# Patient Record
Sex: Female | Born: 1935 | Race: White | Hispanic: No | Smoking: Former smoker
Health system: Southern US, Community
[De-identification: ages and names within clinical notes are randomized; demographics above are authoritative.]

## PROBLEM LIST (undated history)

## (undated) DIAGNOSIS — K5790 Diverticulosis of intestine, part unspecified, without perforation or abscess without bleeding: Secondary | ICD-10-CM

## (undated) DIAGNOSIS — K219 Gastro-esophageal reflux disease without esophagitis: Secondary | ICD-10-CM

## (undated) DIAGNOSIS — H5462 Unqualified visual loss, left eye, normal vision right eye: Secondary | ICD-10-CM

## (undated) DIAGNOSIS — K2270 Barrett's esophagus without dysplasia: Secondary | ICD-10-CM

## (undated) DIAGNOSIS — K449 Diaphragmatic hernia without obstruction or gangrene: Secondary | ICD-10-CM

## (undated) DIAGNOSIS — I10 Essential (primary) hypertension: Secondary | ICD-10-CM

## (undated) DIAGNOSIS — F32A Depression, unspecified: Secondary | ICD-10-CM

## (undated) DIAGNOSIS — E782 Mixed hyperlipidemia: Secondary | ICD-10-CM

## (undated) DIAGNOSIS — I471 Supraventricular tachycardia: Secondary | ICD-10-CM

## (undated) DIAGNOSIS — C189 Malignant neoplasm of colon, unspecified: Secondary | ICD-10-CM

## (undated) DIAGNOSIS — F329 Major depressive disorder, single episode, unspecified: Secondary | ICD-10-CM

## (undated) DIAGNOSIS — I4719 Other supraventricular tachycardia: Secondary | ICD-10-CM

## (undated) DIAGNOSIS — J449 Chronic obstructive pulmonary disease, unspecified: Secondary | ICD-10-CM

## (undated) DIAGNOSIS — Z91018 Allergy to other foods: Secondary | ICD-10-CM

## (undated) HISTORY — DX: Unqualified visual loss, left eye, normal vision right eye: H54.62

## (undated) HISTORY — DX: Supraventricular tachycardia: I47.1

## (undated) HISTORY — DX: Chronic obstructive pulmonary disease, unspecified: J44.9

## (undated) HISTORY — PX: BREAST SURGERY: SHX581

## (undated) HISTORY — DX: Major depressive disorder, single episode, unspecified: F32.9

## (undated) HISTORY — DX: Allergy to other foods: Z91.018

## (undated) HISTORY — DX: Depression, unspecified: F32.A

## (undated) HISTORY — PX: UPPER GASTROINTESTINAL ENDOSCOPY: SHX188

## (undated) HISTORY — DX: Diaphragmatic hernia without obstruction or gangrene: K44.9

## (undated) HISTORY — PX: OTHER SURGICAL HISTORY: SHX169

## (undated) HISTORY — PX: COLONOSCOPY: SHX174

## (undated) HISTORY — DX: Essential (primary) hypertension: I10

## (undated) HISTORY — DX: Mixed hyperlipidemia: E78.2

## (undated) HISTORY — DX: Gastro-esophageal reflux disease without esophagitis: K21.9

## (undated) HISTORY — DX: Malignant neoplasm of colon, unspecified: C18.9

## (undated) HISTORY — DX: Diverticulosis of intestine, part unspecified, without perforation or abscess without bleeding: K57.90

## (undated) HISTORY — DX: Other supraventricular tachycardia: I47.19

## (undated) HISTORY — DX: Barrett's esophagus without dysplasia: K22.70

---

## 2002-08-28 HISTORY — PX: EYE SURGERY: SHX253

## 2008-08-28 HISTORY — PX: RIGHT COLECTOMY: SHX853

## 2011-04-25 ENCOUNTER — Other Ambulatory Visit: Payer: Self-pay

## 2011-12-15 ENCOUNTER — Other Ambulatory Visit: Payer: Self-pay

## 2012-01-08 ENCOUNTER — Encounter: Payer: Self-pay | Admitting: Cardiology

## 2012-01-08 ENCOUNTER — Ambulatory Visit (INDEPENDENT_AMBULATORY_CARE_PROVIDER_SITE_OTHER): Payer: Medicare Other | Admitting: Cardiology

## 2012-01-08 VITALS — BP 134/84 | HR 81 | Ht 65.0 in | Wt 177.0 lb

## 2012-01-08 DIAGNOSIS — I471 Supraventricular tachycardia: Secondary | ICD-10-CM

## 2012-01-08 DIAGNOSIS — R0989 Other specified symptoms and signs involving the circulatory and respiratory systems: Secondary | ICD-10-CM

## 2012-01-08 DIAGNOSIS — H5462 Unqualified visual loss, left eye, normal vision right eye: Secondary | ICD-10-CM | POA: Insufficient documentation

## 2012-01-08 DIAGNOSIS — I10 Essential (primary) hypertension: Secondary | ICD-10-CM

## 2012-01-08 DIAGNOSIS — H5460 Unqualified visual loss, one eye, unspecified: Secondary | ICD-10-CM

## 2012-01-08 DIAGNOSIS — E782 Mixed hyperlipidemia: Secondary | ICD-10-CM

## 2012-01-08 DIAGNOSIS — R002 Palpitations: Secondary | ICD-10-CM

## 2012-01-08 DIAGNOSIS — I779 Disorder of arteries and arterioles, unspecified: Secondary | ICD-10-CM | POA: Insufficient documentation

## 2012-01-08 DIAGNOSIS — I639 Cerebral infarction, unspecified: Secondary | ICD-10-CM

## 2012-01-08 NOTE — Progress Notes (Signed)
Clinical Summary Nichole Meza is a 78 y.o.female referred for cardiology consultation by Dr. Susa Griffins, a dermatologist in [place]. She was previously followed by Dr. Mayford Knife for primary care. She presents with a history of hypertension, no definite CAD or myocardial infarction. Does report a prior history of PAT, describes more frequent sensation of palpitations of late, however no dizziness or syncope. Approximately one month ago she experienced sudden decrease in vision involving the left eye, describes a feeling as if she had "ice on the windshield" on that side. She reports gaining vision back, approximately 1/3 of what she had lost originally, describes field cuts more toward the center of the right side involving her left visual field.  Labwork from April reviewed including hemoglobin A1c 6.1%, vitamin D 51, magnesium 4.4, homocysteine 11.1. Previous lab work also reviewed 2012 showing cholesterol 176, HDL 56, LDL 100, triglycerides 99, potassium 4.2, sodium 140, BUN 14, creatinine 0.8.  We reviewed her medications. Most recent addition was lisinopril. She was already on Norvasc. She had also been using a p.r.n. diuretic in the past. She reports compliance with statin therapy, has also been cutting back carbohydrates in her diet.  She reports no known history of carotid artery disease, indicates no previous imaging studies. Has had no imaging of her brain recently. Reports a remote cardiac workup several years ago that was reassuring overall. No details available.  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 She plays golf 4 days a week, otherwise no regular exercise.  Allergies  Allergen Reactions  . Hctz (Hydrochlorothiazide) Rash  . Nexium (Esomeprazole Magnesium) Rash  . Prilosec (Omeprazole) Rash  . Vancomycin Other (See Comments)    BP dropped    Current Outpatient Prescriptions  Medication Sig Dispense Refill  .  amLODipine (NORVASC) 5 MG tablet Take 1 tablet by mouth Daily.      Marland Kitchen Apple Cid Vn-Grn Tea-Bit Or-Cr (GREEN TEA SLIM) TABS Take 2 tablets by mouth daily.      Marland Kitchen aspirin EC 81 MG tablet Take 81 mg by mouth daily.      Marland Kitchen atorvastatin (LIPITOR) 10 MG tablet Take 1 tablet by mouth Daily.      Florian Buff, Borago officinalis, (BORAGE OIL) 1000 MG CAPS Take 2 capsules by mouth daily.      . busPIRone (BUSPAR) 15 MG tablet Take 15 mg by mouth daily.      . Cholecalciferol (D-3-5) 5000 UNITS capsule Take 5,000 Units by mouth daily.      Marland Kitchen desloratadine (CLARINEX) 5 MG tablet Take 5 mg by mouth daily.      . fish oil-omega-3 fatty acids 1000 MG capsule Take 2 g by mouth daily.       . Flaxseed, Linseed, (FLAX SEED OIL) 1000 MG CAPS Take 2 capsules by mouth daily.       Marland Kitchen FLUoxetine (PROZAC) 20 MG capsule Take 20 mg by mouth daily.      . IRON CR PO Take 1 tablet by mouth daily.      . lansoprazole (PREVACID) 30 MG capsule Take 30 mg by mouth daily.      Marland Kitchen lisinopril (PRINIVIL,ZESTRIL) 10 MG tablet Take 1 tablet by mouth Daily.      . magnesium oxide (MAG-OX 400) 400 (241.3 MG) MG tablet Take 400 mg by mouth daily.      . montelukast (SINGULAIR) 10 MG tablet Take 1 tablet by mouth Daily.      Marland Kitchen  thiamine (VITAMIN B-1) 50 MG tablet Take 50 mg by mouth daily.      Marland Kitchen tiotropium (SPIRIVA) 18 MCG inhalation capsule Place 18 mcg into inhaler and inhale daily.      . Tryptophan 500 MG CAPS Take 1 capsule by mouth daily.      . vitamin A 16109 UNIT capsule Take 10,000 Units by mouth daily.      . vitamin C (ASCORBIC ACID) 500 MG tablet Take 500 mg by mouth 2 (two) times daily.        Past Medical History  Diagnosis Date  . Essential hypertension, benign   . Mixed hyperlipidemia   . COPD (chronic obstructive pulmonary disease)   . Depression   . Barrett's esophagus   . Diverticulosis   . Hiatal hernia   . Colon cancer   . Vision loss of left eye     Sudden - 4/13  . PAT (paroxysmal atrial tachycardia)       Years ago was more problematic    Past Surgical History  Procedure Date  . Left knee surgery   . Colon tumor resection 2010  . Bilateral cataract surgery     Family History  Problem Relation Age of Onset  . Dementia Mother   . Coronary artery disease Father     Lived to age 78    Social History Nichole Meza reports that she quit smoking about 28 years ago. Her smoking use included Cigarettes. She has a 20 pack-year smoking history. She has never used smokeless tobacco. Ms. Hardcastle reports that she does not drink alcohol.  Review of Systems No syncope, no reported bleeding problems. Stable appetite. No orthopnea or PND. Occasional mild lower extremity edema. Reports trouble with allergies, no major flares in COPD. Otherwise negative except as outlined.  Physical Examination Filed Vitals:   01/08/12 1259  BP: 134/84  Pulse: 81   Overweight woman in no acute distress. HEENT: Conjunctiva and lids normal, oropharynx clear. Neck: Supple, no elevated JVP, very soft left carotid bruit, no thyromegaly. Lungs: Clear to auscultation, nonlabored breathing at rest. Cardiac: Regular rate and rhythm, no S3, soft systolic murmur, no pericardial rub. Abdomen: Soft, nontender, bowel sounds present, no guarding or rebound. Extremities: Trace edema and spider veins, distal pulses 1-2+. Skin: Warm and dry. Musculoskeletal: No kyphosis. Neuropsychiatric: Alert and oriented x3, affect grossly appropriate.   ECG Reviewed in EMR.   Problem List and Plan   Vision loss of left eye Patient states that she saw an ophthalmologist and has additional followup pending. She tells me that she was told she had some "swelling" seen on her eye examination. Possibility of a thromboembolic event is certainly to be considered. She reports no other history of cerebrovascular disease. Has been on aspirin standing. We discussed proceeding with a noncontrasted head CT (could not have MRI with metal related to  prior foot pinning and knee surgery), carotid Dopplers, also 2-D echocardiogram. I will have her wear a cardiac monitor for 7 days to exclude atrial fibrillation or flutter. Depending on these studies and her clinical progress, we can determine the next step. Office followup arranged.  Palpitations Reports remote history of PAT.  Need to exclude atrial fibrillation or flutter.  PAT (paroxysmal atrial tachycardia) Reports no history of dizziness or syncope. No prolonged palpitations recently.  Essential hypertension, benign No changes made to current regimen. Also discussed diet and exercise measures. Seems to be avoiding excess sodium reasonably well.  Carotid bruit Very soft, left-sided. Carotid Dopplers  ordered.  Mixed hyperlipidemia On statin therapy. Would aim for LDL under 100 if possible.     Jonelle Sidle, M.D., F.A.C.C.

## 2012-01-08 NOTE — Patient Instructions (Signed)
   7 day cardiac monitor  Echo  Head CT without contrast  Carotid dopplers If the results of your test are normal or stable, you will receive a letter.  If they are abnormal, the nurse will contact you by phone. Follow up in  3-4 weeks

## 2012-01-08 NOTE — Assessment & Plan Note (Signed)
Reports no history of dizziness or syncope. No prolonged palpitations recently.

## 2012-01-08 NOTE — Assessment & Plan Note (Signed)
On statin therapy. Would aim for LDL under 100 if possible.

## 2012-01-08 NOTE — Assessment & Plan Note (Signed)
Patient states that she saw an ophthalmologist and has additional followup pending. She tells me that she was told she had some "swelling" seen on her eye examination. Possibility of a thromboembolic event is certainly to be considered. She reports no other history of cerebrovascular disease. Has been on aspirin standing. We discussed proceeding with a noncontrasted head CT (could not have MRI with metal related to prior foot pinning and knee surgery), carotid Dopplers, also 2-D echocardiogram. I will have her wear a cardiac monitor for 7 days to exclude atrial fibrillation or flutter. Depending on these studies and her clinical progress, we can determine the next step. Office followup arranged.

## 2012-01-08 NOTE — Assessment & Plan Note (Signed)
Reports remote history of PAT.  Need to exclude atrial fibrillation or flutter.

## 2012-01-08 NOTE — Assessment & Plan Note (Signed)
Very soft, left-sided. Carotid Dopplers ordered.

## 2012-01-08 NOTE — Assessment & Plan Note (Signed)
No changes made to current regimen. Also discussed diet and exercise measures. Seems to be avoiding excess sodium reasonably well.

## 2012-01-09 ENCOUNTER — Telehealth: Payer: Self-pay | Admitting: Cardiology

## 2012-01-09 ENCOUNTER — Other Ambulatory Visit: Payer: Self-pay | Admitting: Cardiology

## 2012-01-09 DIAGNOSIS — I639 Cerebral infarction, unspecified: Secondary | ICD-10-CM

## 2012-01-09 DIAGNOSIS — H5462 Unqualified visual loss, left eye, normal vision right eye: Secondary | ICD-10-CM

## 2012-01-09 DIAGNOSIS — R0989 Other specified symptoms and signs involving the circulatory and respiratory systems: Secondary | ICD-10-CM

## 2012-01-09 NOTE — Telephone Encounter (Signed)
CT SCAN OF HEAD W/O CONTRAST 2 D ECHO CAROTID DOPPLERS Scheduled for 01/10/2012 @ [facility] Checking percert

## 2012-01-09 NOTE — Telephone Encounter (Signed)
No precert required per Kenney Houseman. @ [employer] [phone]

## 2012-01-10 DIAGNOSIS — I4891 Unspecified atrial fibrillation: Secondary | ICD-10-CM

## 2012-01-11 DIAGNOSIS — I471 Supraventricular tachycardia: Secondary | ICD-10-CM

## 2012-01-11 DIAGNOSIS — R002 Palpitations: Secondary | ICD-10-CM

## 2012-01-12 ENCOUNTER — Telehealth: Payer: Self-pay | Admitting: *Deleted

## 2012-01-12 NOTE — Telephone Encounter (Signed)
Patient informed. 

## 2012-01-12 NOTE — Telephone Encounter (Signed)
Message copied by Eustace Moore on Fri Jan 12, 2012  4:25 PM ------      Message from: MCDOWELL, Illene Bolus      Created: Thu Jan 11, 2012  1:03 PM       Reviewed. LV function is normal without wall motion abnormality. No major valvular abnormalities. No thrombus.

## 2012-01-12 NOTE — Telephone Encounter (Signed)
Message copied by Eustace Moore on Fri Jan 12, 2012  4:24 PM ------      Message from: MCDOWELL, Illene Bolus      Created: Thu Jan 11, 2012  1:04 PM       Reviewed. Less than 50% RICA stenosis, 50-69% LICA stenosis with moderate calcified plaque. Will discuss further with her at followup.

## 2012-01-12 NOTE — Telephone Encounter (Signed)
Left message for patient to call office.  

## 2012-01-12 NOTE — Telephone Encounter (Signed)
Message copied by Eustace Moore on Fri Jan 12, 2012  4:21 PM ------      Message from: MCDOWELL, Illene Bolus      Created: Thu Jan 11, 2012  1:06 PM       Reviewed. No reported acute findings, although there is evidence of an old, small lacunar infarct involving the right caudate. Would probably be best to initiate Plavix 75 mg daily instead of aspirin at this point, particularly with mild to moderate carotid disease. Await results of cardiac monitor to ensure that there are no other arrhythmias to consider.

## 2012-01-12 NOTE — Telephone Encounter (Signed)
Message copied by Eustace Moore on Fri Jan 12, 2012  3:44 PM ------      Message from: MCDOWELL, Illene Bolus      Created: Thu Jan 11, 2012  1:04 PM       Reviewed. Less than 50% RICA stenosis, 50-69% LICA stenosis with moderate calcified plaque. Will discuss further with her at followup.

## 2012-01-15 ENCOUNTER — Other Ambulatory Visit: Payer: Self-pay | Admitting: *Deleted

## 2012-01-15 MED ORDER — CLOPIDOGREL BISULFATE 75 MG PO TABS: 75.0000 mg | ORAL_TABLET | Freq: Every day | ORAL | Status: DC

## 2012-02-09 ENCOUNTER — Ambulatory Visit: Payer: Medicare Other | Admitting: Cardiology

## 2012-02-23 ENCOUNTER — Encounter: Payer: Self-pay | Admitting: Cardiology

## 2012-02-23 ENCOUNTER — Ambulatory Visit (INDEPENDENT_AMBULATORY_CARE_PROVIDER_SITE_OTHER): Payer: Medicare Other | Admitting: Cardiology

## 2012-02-23 VITALS — BP 110/70 | HR 87 | Ht 65.5 in | Wt 171.0 lb

## 2012-02-23 DIAGNOSIS — R002 Palpitations: Secondary | ICD-10-CM

## 2012-02-23 DIAGNOSIS — H5460 Unqualified visual loss, one eye, unspecified: Secondary | ICD-10-CM

## 2012-02-23 DIAGNOSIS — E782 Mixed hyperlipidemia: Secondary | ICD-10-CM

## 2012-02-23 DIAGNOSIS — I10 Essential (primary) hypertension: Secondary | ICD-10-CM

## 2012-02-23 DIAGNOSIS — H5462 Unqualified visual loss, left eye, normal vision right eye: Secondary | ICD-10-CM

## 2012-02-23 DIAGNOSIS — I779 Disorder of arteries and arterioles, unspecified: Secondary | ICD-10-CM

## 2012-02-23 MED ORDER — CLOPIDOGREL BISULFATE 75 MG PO TABS: 75.0000 mg | ORAL_TABLET | Freq: Every day | ORAL | Status: DC

## 2012-02-23 NOTE — Assessment & Plan Note (Signed)
Blood pressure looks well controlled today. 

## 2012-02-23 NOTE — Assessment & Plan Note (Signed)
No evidence of atrial fibrillation or atrial flutter by recent cardiac monitoring.

## 2012-02-23 NOTE — Patient Instructions (Signed)
Your physician recommends that you schedule a follow-up appointment in: 6 months with [facility]. You will receive a reminder letter in the mail in about 4 months reminding you to call and schedule your appointment. If you don't receive this letter, please contact our office.   Your physician recommends that you continue on your current medications as directed. Please refer to the Current Medication list given to you today. Stop taking aspirin.   Your physician has requested that you have a carotid duplex in 6 months just before your next appointment. This test is an ultrasound of the carotid arteries in your neck. It looks at blood flow through these arteries that supply the brain with blood. Allow one hour for this exam. There are no restrictions or special instructions.

## 2012-02-23 NOTE — Progress Notes (Signed)
Clinical Summary Ms. Girton is a 78 y.o.female presenting for followup. She was seen in May. She states that she has been doing reasonably well, has lost 6 pounds since last visit through diet. Still following with an ophthalmologist for her vision change. States that this has been improving somewhat.  Carotid Dopplers demonstrated less than 50% RICA stenosis with 50-69% LICA stenosis and moderately calcified plaque. Echocardiogram demonstrated LVEF of 60-65% with diastolic dysfunction, moderate aortic sclerosis with trace aortic regurgitation, trace tricuspid regurgitation, RVSP 30 mm mercury. Head CT scan demonstrated no acute findings but evidence of an old, small lacunar infarct involving the right caudate. Cardiac monitor showed sinus rhythm with no evidence of atrial fibrillation or flutter. We reviewed all these studies today.  She has been taking both aspirin and Plavix, reports easy bruising. Otherwise no major bleeding problems.   Allergies  Allergen Reactions  . Hctz (Hydrochlorothiazide) Rash  . Nexium (Esomeprazole Magnesium) Rash  . Prilosec (Omeprazole) Rash  . Vancomycin Other (See Comments)    BP dropped    Current Outpatient Prescriptions  Medication Sig Dispense Refill  . amLODipine (NORVASC) 5 MG tablet Take 1 tablet by mouth Daily.      Marland Kitchen atorvastatin (LIPITOR) 10 MG tablet Take 1 tablet by mouth Daily.      Florian Buff, Borago officinalis, (BORAGE OIL) 1000 MG CAPS Take 2 capsules by mouth daily.      . Cholecalciferol (D-3-5) 5000 UNITS capsule Take 5,000 Units by mouth daily.      . clopidogrel (PLAVIX) 75 MG tablet Take 1 tablet (75 mg total) by mouth daily.  30 tablet  6  . desloratadine (CLARINEX) 5 MG tablet Take 5 mg by mouth daily.      . fish oil-omega-3 fatty acids 1000 MG capsule Take 2 g by mouth daily.       . Flaxseed, Linseed, (FLAX SEED OIL) 1000 MG CAPS Take 2 capsules by mouth daily.       . fluorouracil (EFUDEX) 5 % cream Apply 1 application  topically as needed.       Chilton Si Tea, Camillia sinensis, ([employer] GREEN TEA EXTRACT PO) Take 400 mg by mouth daily.      . imiquimod (ALDARA) 5 % cream Apply 1 application topically as needed.       . IRON PO Take 1 capsule by mouth daily. Daily two caps with Iron      . magnesium oxide (MAG-OX 400) 400 (241.3 MG) MG tablet Take 400 mg by mouth daily.      . montelukast (SINGULAIR) 10 MG tablet Take 1 tablet by mouth as needed.       . sucralfate (CARAFATE) 1 G tablet Take 1 g by mouth daily.       Marland Kitchen thiamine (VITAMIN B-1) 50 MG tablet Take 50 mg by mouth daily.      Marland Kitchen tiotropium (SPIRIVA) 18 MCG inhalation capsule Place 18 mcg into inhaler and inhale as needed.       . tretinoin (RETIN-A) 0.05 % cream Apply 1 application topically daily.       . Tryptophan 500 MG CAPS Take 1 capsule by mouth daily.      . vitamin A 14782 UNIT capsule Take 10,000 Units by mouth daily.      . vitamin C (ASCORBIC ACID) 500 MG tablet Take 500 mg by mouth 2 (two) times daily.        Past Medical History  Diagnosis Date  . Essential hypertension, benign   .  Mixed hyperlipidemia   . COPD (chronic obstructive pulmonary disease)   . Depression   . Barrett's esophagus   . Diverticulosis   . Hiatal hernia   . Colon cancer   . Vision loss of left eye     Sudden - 4/13  . PAT (paroxysmal atrial tachycardia)     Years ago was more problematic    Social History Ms. Wollenberg reports that she quit smoking about 28 years ago. Her smoking use included Cigarettes. She has a 20 pack-year smoking history. She has never used smokeless tobacco. Ms. Ballen reports that she does not drink alcohol.  Review of Systems Occasional palpitations, occasional lightheadedness. No syncope. No exertional chest pain. Improved dyspnea on exertion. Otherwise negative.  Physical Examination Filed Vitals:   02/23/12 0849  BP: 110/70  Pulse: 87    Overweight woman in no acute distress.  HEENT: Conjunctiva and lids normal, oropharynx  clear.  Neck: Supple, no elevated JVP, very soft left carotid bruit, no thyromegaly.  Lungs: Clear to auscultation, nonlabored breathing at rest.  Cardiac: Regular rate and rhythm, no S3, soft systolic murmur, no pericardial rub.  Abdomen: Soft, nontender, bowel sounds present, no guarding or rebound.  Extremities: Trace edema and spider veins, distal pulses 1-2+.  Skin: Warm and dry.  Musculoskeletal: No kyphosis.  Neuropsychiatric: Alert and oriented x3, affect grossly appropriate.   Problem List and Plan   Palpitations No evidence of atrial fibrillation or atrial flutter by recent cardiac monitoring.  Vision loss of left eye Most likely embolic event. As described, no definite atrial arrhythmias were noted by cardiac monitoring. She does have evidence of a previous small lacunar CVA by head CT, also moderate carotid artery disease. She will continue Plavix, discontinue aspirin for now. Otherwise focus on risk factor modification, blood pressure reduction, lipid management aiming for LDL under 100.  Mixed hyperlipidemia Continue on statin therapy, goal LDL at least under 100.  Essential hypertension, benign Blood pressure looks well controlled today.  Carotid artery disease Followup carotid Dopplers in 6 months.    Jonelle Sidle, M.D., F.A.C.C.

## 2012-02-23 NOTE — Assessment & Plan Note (Signed)
Continue on statin therapy, goal LDL at least under 100.

## 2012-02-23 NOTE — Assessment & Plan Note (Signed)
Followup carotid Dopplers in 6 months. 

## 2012-02-23 NOTE — Assessment & Plan Note (Signed)
Most likely embolic event. As described, no definite atrial arrhythmias were noted by cardiac monitoring. She does have evidence of a previous small lacunar CVA by head CT, also moderate carotid artery disease. She will continue Plavix, discontinue aspirin for now. Otherwise focus on risk factor modification, blood pressure reduction, lipid management aiming for LDL under 100.

## 2012-02-23 NOTE — Addendum Note (Signed)
Addended by: Eustace Moore on: 02/23/2012 09:15 AM   Modules accepted: Orders

## 2012-07-24 ENCOUNTER — Other Ambulatory Visit: Payer: Self-pay | Admitting: *Deleted

## 2012-07-24 DIAGNOSIS — I6529 Occlusion and stenosis of unspecified carotid artery: Secondary | ICD-10-CM

## 2012-07-31 ENCOUNTER — Encounter (INDEPENDENT_AMBULATORY_CARE_PROVIDER_SITE_OTHER): Payer: Medicare Other

## 2012-07-31 DIAGNOSIS — I6529 Occlusion and stenosis of unspecified carotid artery: Secondary | ICD-10-CM

## 2012-08-06 ENCOUNTER — Telehealth: Payer: Self-pay | Admitting: *Deleted

## 2012-08-06 NOTE — Telephone Encounter (Signed)
Message copied by Eustace Moore on Tue Aug 06, 2012  8:55 AM ------      Message from: MCDOWELL, Illene Bolus      Created: Mon Aug 05, 2012 11:22 AM       Still with evidence of stable mild to moderate ICA disease as per report. Continue medical therapy and observation.

## 2012-08-06 NOTE — Telephone Encounter (Signed)
Patient informed. 

## 2012-08-16 ENCOUNTER — Ambulatory Visit: Payer: Medicare Other | Admitting: Cardiology

## 2012-10-02 ENCOUNTER — Encounter: Payer: Self-pay | Admitting: Cardiology

## 2012-10-02 ENCOUNTER — Ambulatory Visit (INDEPENDENT_AMBULATORY_CARE_PROVIDER_SITE_OTHER): Payer: Medicare Other | Admitting: Cardiology

## 2012-10-02 ENCOUNTER — Encounter: Payer: Self-pay | Admitting: Internal Medicine

## 2012-10-02 VITALS — BP 112/73 | HR 77 | Ht 65.5 in | Wt 157.0 lb

## 2012-10-02 DIAGNOSIS — I779 Disorder of arteries and arterioles, unspecified: Secondary | ICD-10-CM

## 2012-10-02 MED ORDER — ROSUVASTATIN CALCIUM 5 MG PO TABS: 5.0000 mg | ORAL_TABLET | Freq: Every day | ORAL | Status: DC

## 2012-10-02 NOTE — Progress Notes (Signed)
Clinical Summary Nichole Meza is a 79 y.o.female presenting for followup. She was seen in June 2013. She reports no obvious neurological symptoms, stable vision, no focal motor weakness or speech problems. Furthermore, no sense of palpitations or chest pain.  Carotid Dopplers from December 2013 revealed stable disease, 0-39% RICA stenosis and 40-59% LICA stenosis. We reviewed these today.  She continues on Plavix and Lipitor. She did ask about possibly trying a different statin since she has concurrently been having some difficulty managing her glucose, and was wondering if Lipitor might be contributing.   Allergies  Allergen Reactions  . Hctz (Hydrochlorothiazide) Rash  . Nexium (Esomeprazole Magnesium) Rash  . Prilosec (Omeprazole) Rash  . Vancomycin Other (See Comments)    BP dropped    Current Outpatient Prescriptions  Medication Sig Dispense Refill  . amLODipine (NORVASC) 5 MG tablet Take 1 tablet by mouth Daily.      Florian Buff, Borago officinalis, (BORAGE OIL) 1000 MG CAPS Take 2 capsules by mouth daily.      . Cholecalciferol (D-3-5) 5000 UNITS capsule Take 5,000 Units by mouth daily.      . clopidogrel (PLAVIX) 75 MG tablet Take 1 tablet (75 mg total) by mouth daily.  90 tablet  3  . desloratadine (CLARINEX) 5 MG tablet Take 5 mg by mouth daily as needed.       . fish oil-omega-3 fatty acids 1000 MG capsule Take 2 g by mouth daily.       . Flaxseed, Linseed, (FLAX SEED OIL) 1000 MG CAPS Take 2 capsules by mouth daily.       Marland Kitchen FLUoxetine (PROZAC) 20 MG capsule Take 20 mg by mouth daily.      Chilton Si Tea, Camillia sinensis, ([employer] GREEN TEA EXTRACT PO) Take 400 mg by mouth daily.      . imiquimod (ALDARA) 5 % cream Apply 1 application topically as needed.       Marland Kitchen lisinopril (PRINIVIL,ZESTRIL) 10 MG tablet Take 1 tablet by mouth Daily.      . magnesium oxide (MAG-OX 400) 400 (241.3 MG) MG tablet Take 400 mg by mouth daily.      . sucralfate (CARAFATE) 1 G tablet Take 1 g by mouth  daily.       Marland Kitchen thiamine (VITAMIN B-1) 50 MG tablet Take 50 mg by mouth daily.      Marland Kitchen tiotropium (SPIRIVA) 18 MCG inhalation capsule Place 18 mcg into inhaler and inhale as needed.       . Tryptophan 500 MG CAPS Take 1 capsule by mouth daily.      . vitamin A 52841 UNIT capsule Take 10,000 Units by mouth daily.      . vitamin C (ASCORBIC ACID) 500 MG tablet Take 500 mg by mouth 2 (two) times daily.      . rosuvastatin (CRESTOR) 5 MG tablet Take 1 tablet (5 mg total) by mouth daily.  90 tablet  3  . tretinoin (RETIN-A) 0.05 % cream Apply 1 application topically daily.         Past Medical History  Diagnosis Date  . Essential hypertension, benign   . Mixed hyperlipidemia   . COPD (chronic obstructive pulmonary disease)   . Depression   . Barrett's esophagus   . Diverticulosis   . Hiatal hernia   . Colon cancer   . Vision loss of left eye     Sudden - 4/13  . PAT (paroxysmal atrial tachycardia)     Years ago was  more problematic    Social History Nichole Meza reports that she quit smoking about 29 years ago. Her smoking use included Cigarettes. She has a 20 pack-year smoking history. She has never used smokeless tobacco. Nichole Meza reports that she does not drink alcohol.  Review of Systems Negative except as outlined.  Physical Examination Filed Vitals:   10/02/12 1450  BP: 112/73  Pulse: 77   Filed Weights   10/02/12 1450  Weight: 157 lb (71.215 kg)   No acute distress.  HEENT: Conjunctiva and lids normal, oropharynx clear.  Neck: Supple, no elevated JVP, very soft left carotid bruit, no thyromegaly.  Lungs: Clear to auscultation, nonlabored breathing at rest.  Cardiac: Regular rate and rhythm, no S3, soft systolic murmur, no pericardial rub.  Extremities: Trace edema and spider veins, distal pulses 1-2+.  Neuropsychiatric: Alert and oriented x3, affect grossly appropriate.   Problem List and Plan   Carotid artery disease Mild to moderate as noted above. Plan to  continue Plavix and statin. Followup carotid Dopplers for December 2014.  Mixed hyperlipidemia Plan to switch from Lipitor to Crestor 5 mg daily as discussed above. She can have followup FLP and LFT with primary care physician in about 12 weeks.  Essential hypertension, benign Blood pressure well controlled today.    Jonelle Sidle, M.D., F.A.C.C.

## 2012-10-02 NOTE — Assessment & Plan Note (Signed)
Plan to switch from Lipitor to Crestor 5 mg daily as discussed above. She can have followup FLP and LFT with primary care physician in about 12 weeks.

## 2012-10-02 NOTE — Assessment & Plan Note (Signed)
Blood pressure well-controlled today. 

## 2012-10-02 NOTE — Patient Instructions (Addendum)
Your physician recommends that you schedule a follow-up appointment in: 10 months. You will receive a reminder letter in the mail in about 8 months reminding you to call and schedule your appointment. If you don't receive this letter, please contact our office.  Your physician has recommended you make the following change in your medication: Stop atorvastatin and start crestor 5 mg daily. Your new prescription has been sent to your pharmacy. All other medications will remain the same. Your physician has requested that you have a carotid duplex in December 2014 just before your next appointment. You will be contacted by our office when this is due. This test is an ultrasound of the carotid arteries in your neck. It looks at blood flow through these arteries that supply the brain with blood. Allow one hour for this exam. There are no restrictions or special instructions.

## 2012-10-02 NOTE — Assessment & Plan Note (Signed)
Mild to moderate as noted above. Plan to continue Plavix and statin. Followup carotid Dopplers for December 2014.

## 2012-10-23 ENCOUNTER — Ambulatory Visit (INDEPENDENT_AMBULATORY_CARE_PROVIDER_SITE_OTHER): Payer: Medicare Other | Admitting: Internal Medicine

## 2012-10-23 ENCOUNTER — Encounter: Payer: Self-pay | Admitting: Internal Medicine

## 2012-10-23 VITALS — BP 128/72 | HR 76 | Ht 65.5 in | Wt 161.6 lb

## 2012-10-23 DIAGNOSIS — K219 Gastro-esophageal reflux disease without esophagitis: Secondary | ICD-10-CM

## 2012-10-23 DIAGNOSIS — K2270 Barrett's esophagus without dysplasia: Secondary | ICD-10-CM

## 2012-10-23 NOTE — Patient Instructions (Addendum)
You have been scheduled for an endoscopy with propofol. Please follow written instructions given to you at your visit today. If you use inhalers (even only as needed) or a CPAP machine, please bring them with you on the day of your procedure.  Continue your Plavix .  Thank you for choosing me and [facility].  Iva Boop, M.D., [facility]

## 2012-10-23 NOTE — Progress Notes (Signed)
Subjective:    Patient ID: Nichole Meza, female    DOB: 1931/07/23, 79 y.o.   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 She last had an evaluation for this with EGD and bx in late 2010 showing a large hiatal hernia, columnar changes above suspected Z line and confirmed intestinal metaplasia. She has had other EGD's in past and says no dysplasia/problems. She is ok now - having stopped chronic Prevacid after developing muscle cramps and hypomagnasemia. She has gone on a diet at direction of PCP and lost 20# so rarely has heartburn or hoarseness which is manifestion of her GERD. No dysphagia. Allergies  Allergen Reactions  . Hctz (Hydrochlorothiazide) Rash  . Nexium (Esomeprazole Magnesium) Rash  . Prilosec (Omeprazole) Rash  . Vancomycin Other (See Comments)    BP dropped   Outpatient Prescriptions Prior to Visit  Medication Sig Dispense Refill  . amLODipine (NORVASC) 5 MG tablet Take 1 tablet by mouth Daily.      Florian Buff, Borago officinalis, (BORAGE OIL) 1000 MG CAPS Take 2 capsules by mouth daily.      . Cholecalciferol (D-3-5) 5000 UNITS capsule Take 5,000 Units by mouth daily.      . clopidogrel (PLAVIX) 75 MG tablet Take 1 tablet (75 mg total) by mouth daily.  90 tablet  3  . desloratadine (CLARINEX) 5 MG tablet Take 5 mg by mouth daily as needed.       . fish oil-omega-3 fatty acids 1000 MG capsule Take 2 g by mouth daily.       . Flaxseed, Linseed, (FLAX SEED OIL) 1000 MG CAPS Take 2 capsules by mouth daily.       Marland Kitchen FLUoxetine (PROZAC) 20 MG capsule Take 20 mg by mouth daily.      Chilton Si Tea, Camillia sinensis, ([employer] GREEN TEA EXTRACT PO) Take 400 mg by mouth daily.      . imiquimod (ALDARA) 5 % cream Apply 1 application topically as needed.       Marland Kitchen lisinopril (PRINIVIL,ZESTRIL) 10 MG tablet Take 1 tablet by mouth Daily.      . magnesium oxide (MAG-OX 400) 400 (241.3 MG) MG tablet Take 400 mg by mouth daily.      .  rosuvastatin (CRESTOR) 5 MG tablet Take 1 tablet (5 mg total) by mouth daily.  90 tablet  3  . sucralfate (CARAFATE) 1 G tablet Take 1 g by mouth daily.       Marland Kitchen thiamine (VITAMIN B-1) 50 MG tablet Take 50 mg by mouth daily.      Marland Kitchen tiotropium (SPIRIVA) 18 MCG inhalation capsule Place 18 mcg into inhaler and inhale as needed.       . tretinoin (RETIN-A) 0.05 % cream Apply 1 application topically daily.       . Tryptophan 500 MG CAPS Take 1 capsule by mouth daily.      . vitamin A 40981 UNIT capsule Take 10,000 Units by mouth daily.      . vitamin C (ASCORBIC ACID) 500 MG tablet Take 500 mg by mouth 2 (two) times daily.       No facility-administered medications prior to visit.   Past Medical History  Diagnosis Date  . Essential hypertension, benign   . Mixed hyperlipidemia   . COPD (chronic obstructive pulmonary disease)   . Depression   . Barrett's esophagus   . Diverticulosis   . Hiatal hernia   . Colon cancer   .  Vision loss of left eye     Sudden - 4/13  . PAT (paroxysmal atrial tachycardia)     Years ago was more problematic  . GERD (gastroesophageal reflux disease)    Past Surgical History  Procedure Laterality Date  . Left knee surgery    . Right colectomy  2010  . Bilateral cataract surgery    . Upper gastrointestinal endoscopy    . Colonoscopy     History   Social History  . Marital Status: Widowed    Spouse Name: N/A    Number of Children: 2 1 in [place] and 1 in [place]  .     Occupational History  . RETIRED     Cardiac nurse   Social History Main Topics  . Smoking status: Former Smoker -- 1.00 packs/day for 20 years    Types: Cigarettes    Quit date: 08/29/1983  . Smokeless tobacco: Never Used  . Alcohol Use: No  . Drug Use: No  . Sexually Active: None     Family History  Problem Relation Age of Onset  . Dementia Mother   . Coronary artery disease Father     Lived to age 52        Review of Systems Left episcleritis recently Tx eye  gtt and ibuprofen All other ROS negative or as per HPI    Objective:   Physical Exam General:  Well-developed, well-nourished and in no acute distress Eyes:  anicteric. ENT:   Mouth and posterior pharynx free of lesions.  Neck:   supple  Lungs: Clear to auscultation bilaterally. Heart:  S1S2, no rubs, murmurs, gallops. Abdomen:  soft, non-tender, no hepatosplenomegaly, mass and BS+. + surgical scar  Lymph:  no cervical or supraclavicular adenopathy. Extremities:   no edema Skin   no rash. Neuro:  A&O x 3.  Psych:  appropriate mood and  Affect.   Data Reviewed: 2012 colonoscopy 2010 EGD 2010 esophageal, duodenal and colon pathology    Assessment & Plan:  Barrett's esophagus  GERD (gastroesophageal reflux disease)  Personal Hx Right Colon Cancer resected 2011  1. Schedule EGD to evaluate and perform surveillance The risks and benefits as well as alternatives of endoscopic procedure(s) have been discussed and reviewed. All questions answered. The patient agrees to proceed. Colonoscopy next 2015  I appreciate the opportunity to care for this patient.  ZO:[email or web address],VWUJW, MD

## 2012-10-25 ENCOUNTER — Encounter: Payer: Self-pay | Admitting: Internal Medicine

## 2012-10-25 ENCOUNTER — Ambulatory Visit (AMBULATORY_SURGERY_CENTER): Payer: Medicare Other | Admitting: Internal Medicine

## 2012-10-25 VITALS — BP 122/54 | HR 73 | Temp 98.6°F | Resp 18

## 2012-10-25 DIAGNOSIS — K2270 Barrett's esophagus without dysplasia: Secondary | ICD-10-CM

## 2012-10-25 DIAGNOSIS — K319 Disease of stomach and duodenum, unspecified: Secondary | ICD-10-CM

## 2012-10-25 DIAGNOSIS — D1330 Benign neoplasm of unspecified part of small intestine: Secondary | ICD-10-CM

## 2012-10-25 DIAGNOSIS — K449 Diaphragmatic hernia without obstruction or gangrene: Secondary | ICD-10-CM

## 2012-10-25 DIAGNOSIS — K317 Polyp of stomach and duodenum: Secondary | ICD-10-CM

## 2012-10-25 DIAGNOSIS — K219 Gastro-esophageal reflux disease without esophagitis: Secondary | ICD-10-CM

## 2012-10-25 MED ORDER — SODIUM CHLORIDE 0.9 % IV SOLN: 500.0000 mL | INTRAVENOUS | Status: DC

## 2012-10-25 NOTE — Patient Instructions (Addendum)
There is a tiny area of suspected Barrett's esophagus - and a huge hiatal hernia. I also found a small polyp in the duodenum.  I took biopsies of the possible Barrett's and the polyp. Nothing looks alarming on those two but the hiatal hernia is large and I wonder if it does not twist sometimes.   We will call with biopsy results.  I do not think you need to completely avoid alcohol because of the Barrett's esophagus.  YOU HAD AN ENDOSCOPIC PROCEDURE TODAY AT THE [facility]: Refer to the procedure report that was given to you for any specific questions about what was found during the examination.  If the procedure report does not answer your questions, please call your gastroenterologist to clarify.  If you requested that your care partner not be given the details of your procedure findings, then the procedure report has been included in a sealed envelope for you to review at your convenience later.  YOU SHOULD EXPECT: Some feelings of bloating in the abdomen. Passage of more gas than usual.  Walking can help get rid of the air that was put into your GI tract during the procedure and reduce the bloating. If you had a lower endoscopy (such as a colonoscopy or flexible sigmoidoscopy) you may notice spotting of blood in your stool or on the toilet paper. If you underwent a bowel prep for your procedure, then you may not have a normal bowel movement for a few days.  DIET: Your first meal following the procedure should be a light meal and then it is ok to progress to your normal diet.  A half-sandwich or bowl of soup is an example of a good first meal.  Heavy or fried foods are harder to digest and may make you feel nauseous or bloated.  Likewise meals heavy in dairy and vegetables can cause extra gas to form and this can also increase the bloating.  Drink plenty of fluids but you should avoid alcoholic beverages for 24 hours.  ACTIVITY: Your care partner should take you home directly after the  procedure.  You should plan to take it easy, moving slowly for the rest of the day.  You can resume normal activity the day after the procedure however you should NOT DRIVE or use heavy machinery for 24 hours (because of the sedation medicines used during the test).    SYMPTOMS TO REPORT IMMEDIATELY: A gastroenterologist can be reached at any hour.  During normal business hours, 8:30 AM to 5:00 PM Monday through Friday, call [phone].  After hours and on weekends, please call the GI answering service at [phone] who will take a message and have the physician on call contact you.   Following upper endoscopy (EGD)  Vomiting of blood or coffee ground material  New chest pain or pain under the shoulder blades  Painful or persistently difficult swallowing  New shortness of breath  Fever of 100F or higher  Black, tarry-looking stools  FOLLOW UP: If any biopsies were taken you will be contacted by phone or by letter within the next 1-3 weeks.  Call your gastroenterologist if you have not heard about the biopsies in 3 weeks.  Our staff will call the home number listed on your records the next business day following your procedure to check on you and address any questions or concerns that you may have at that time regarding the information given to you following your procedure. This is a courtesy call and so  if there is no answer at the home number and we have not heard from you through the emergency physician on call, we will assume that you have returned to your regular daily activities without incident.  SIGNATURES/CONFIDENTIALITY: You and/or your care partner have signed paperwork which will be entered into your electronic medical record.  These signatures attest to the fact that that the information above on your After Visit Summary has been reviewed and is understood.  Full responsibility of the confidentiality of this discharge information lies with you and/or your care-partner.  The  office will call you with results and plans.    Do not drink alcohol if you have Barrett's.

## 2012-10-25 NOTE — Progress Notes (Signed)
Called to room to assist during endoscopic procedure.  Patient ID and intended procedure confirmed with present staff. Received instructions for my participation in the procedure from the performing physician. ewm 

## 2012-10-25 NOTE — Op Note (Signed)
[facility] [address] [place], [address]   ENDOSCOPY PROCEDURE REPORT  PATIENT: Nichole, Meza  MR#: [id number] BIRTHDATE: October 28, 1930 , 81  yrs. old GENDER: Female ENDOSCOPIST: Iva Boop, MD, [facility] PROCEDURE DATE:  10/25/2012 PROCEDURE:  EGD w/ biopsy ASA CLASS:     Class II INDICATIONS:  follow up of Barrett's esophagus. MEDICATIONS: Propofol (Diprivan) 170 mg IV, MAC sedation, administered by CRNA, and These medications were titrated to patient response per physician's verbal order TOPICAL ANESTHETIC: Cetacaine Spray  DESCRIPTION OF PROCEDURE: After the risks benefits and alternatives of the procedure were thoroughly explained, informed consent was obtained.  The LB [id number]-H180 [id number] endoscope was introduced through the mouth and advanced to the second portion of the duodenum. Without limitations.  The instrument was slowly withdrawn as the mucosa was fully examined.        ESOPHAGUS: There was evidence of Barrett's esophagus at the gastroesophageal junction.  Slightly irregular z -line which was at 33 cm, above massive hiatal hernia. Two areas that looked columnar, not > 5mm. Multiple biopsies were performed using cold forceps. Sample sent for histology.   A large hiatal hernia was noted.  The hernia may be paraesophageal as scope preferentially retroflexed with in it  DUODENUM: A polypoid shaped sessile polyp measuring 8 mm in size was found in the 2nd part of the duodenum.  Multiple biopsies was performed using cold forceps.  Sample sent for histology.  The remainder of the upper endoscopy exam was otherwise normal. Retroflexed views revealed a hiatal hernia.     The scope was then withdrawn from the patient and the procedure completed.  COMPLICATIONS: There were no complications. ENDOSCOPIC IMPRESSION: 1.   There was evidence of Barrett's esophagus; multiple biopsies 2.   Large hiatal hernia ? paraesophageal 3.   Sessile polyp  measuring 8 mm in size was found in the 2nd part of the duodenum 4.   The remainder of the upper endoscopy exam was otherwise normal   RECOMMENDATIONS: Office will call with results and plans. Would need to be off clopidogrel to perform polypectomy, if indicated. Consider Upper GI series to characterize the hernia - she might benefit from repair but did not have hx of dysphagia.  REPEAT EXAM: may not be needed at her age  eSigned:  Iva Boop, MD, [facility] 10/25/2012 3:14 PM   CC:        Charlesetta Shanks, MD abnd The Patient  PATIENT NAME:  Nichole, Meza MR#: [id number]

## 2012-10-25 NOTE — Progress Notes (Addendum)
Patient did not have preoperative order for IV antibiotic SSI prophylaxis. (G8918)  Patient did not experience any of the following events: a burn prior to discharge; a fall within the facility; wrong site/side/patient/procedure/implant event; or a hospital transfer or hospital admission upon discharge from the facility. (G8907)  

## 2012-10-28 ENCOUNTER — Telehealth: Payer: Self-pay

## 2012-10-28 NOTE — Telephone Encounter (Signed)
  Follow up Call-  Call back number 10/25/2012  Post procedure Call Back phone  # [phone]  Permission to leave phone message Yes     Patient questions:  Do you have a fever, pain , or abdominal swelling? no Pain Score  0 *  Have you tolerated food without any problems? yes  Have you been able to return to your normal activities? yes  Do you have any questions about your discharge instructions: Diet   no Medications  no Follow up visit  no  Do you have questions or concerns about your Care? no  Actions: * If pain score is 4 or above: No action needed, pain <4.  No problems per the pt. Maw

## 2012-10-31 NOTE — Progress Notes (Signed)
Quick Note:  Call from office  1) no Barrett's 2) duodenal polyp was not pre-cancerous - looks like inflammation 3) schedule upper GI series to evaluate hiatal hernia - arrange REV after that - can be done after her vacation to [place]  No letter or recall in [facility] ______

## 2012-11-04 ENCOUNTER — Other Ambulatory Visit: Payer: Self-pay

## 2012-11-04 DIAGNOSIS — K449 Diaphragmatic hernia without obstruction or gangrene: Secondary | ICD-10-CM

## 2012-11-06 ENCOUNTER — Encounter: Payer: Self-pay | Admitting: Internal Medicine

## 2012-12-03 ENCOUNTER — Ambulatory Visit (HOSPITAL_COMMUNITY)
Admission: RE | Admit: 2012-12-03 | Discharge: 2012-12-03 | Disposition: A | Discharge status: Home / Self Care | Payer: Medicare Other | Source: Ambulatory Visit | Attending: Internal Medicine | Admitting: Internal Medicine

## 2012-12-03 DIAGNOSIS — K319 Disease of stomach and duodenum, unspecified: Secondary | ICD-10-CM | POA: Insufficient documentation

## 2012-12-03 DIAGNOSIS — K449 Diaphragmatic hernia without obstruction or gangrene: Secondary | ICD-10-CM | POA: Insufficient documentation

## 2012-12-03 NOTE — Progress Notes (Signed)
Quick Note:  Will discuss at REV 4/15 ______

## 2012-12-10 ENCOUNTER — Ambulatory Visit (INDEPENDENT_AMBULATORY_CARE_PROVIDER_SITE_OTHER): Payer: Medicare Other | Admitting: Internal Medicine

## 2012-12-10 ENCOUNTER — Encounter: Payer: Self-pay | Admitting: Internal Medicine

## 2012-12-10 VITALS — BP 108/64 | HR 88 | Ht 63.75 in | Wt 161.1 lb

## 2012-12-10 DIAGNOSIS — K319 Disease of stomach and duodenum, unspecified: Secondary | ICD-10-CM

## 2012-12-10 DIAGNOSIS — K3189 Other diseases of stomach and duodenum: Secondary | ICD-10-CM | POA: Insufficient documentation

## 2012-12-10 DIAGNOSIS — K449 Diaphragmatic hernia without obstruction or gangrene: Secondary | ICD-10-CM | POA: Insufficient documentation

## 2012-12-10 NOTE — Progress Notes (Signed)
  Subjective:    Patient ID: Nichole Meza, female    DOB: 1931-04-19, 79 y.o.   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 She had an upper GI endoscopy followup of Barrett's esophagus. It showed a large hiatal hernia. She's a long history of upper abdominal discomfort and chest pain and difficulty eating. Upper GI series showed 60% of the stomach in the chest, and an organoaxial gastric volvulus.  Medications, allergies, past medical history, past surgical history, family history and social history are reviewed and updated in the EMR.  Review of Systems She had a good trip to [place] She has COPD but generally has been doing well. He thinks some of her problems might be for most of her stomach being in her chest.    Objective:   Physical Exam Elderly, no acute distress looking younger than stated     Assessment & Plan:   1. Hiatal hernia -  large and symptomatic   2. Chronic gastric volvulus    1. I recommended she have a surgical consultation and consider surgical repair of this. She is elderly and has some legitimate concerns about recovery and how she would tolerate the procedure given co-morbidities though I think she would do okay. 2. Told her my concern is the older she gets though less of a good operative candidate she is and she could have some very real acute serious problems or perhaps on fixable difficulties with this hiatal hernia and volvulus. I suspect she's had a long time and has tolerated it and perhaps would get by without surgery but think it best she talked to an experienced laparoscopic surgeon about treating this. Admittedly her previous colon resection could have some effect on the ability to have a laparoscopic resection and she is aware. 3. She has some anxiety heightened by her previous work experience as a Engineer, civil (consulting) and has some concerns about hospitalization and the risks  associated with that. We talked about that some today.  I appreciate the opportunity to care for this patient.  15 mins time spent > half counseling/coordination of care  CC: Charlesetta Shanks, MD and Ovidio Kin, MD

## 2012-12-10 NOTE — Patient Instructions (Addendum)
You have been scheduled for an appointment with Dr. Ovidio Kin at [facility]. Your appointment is on May 1st at 2:45pm. Please arrive at 2:30pm for registration. Make certain to bring a list of current medications, including any over the counter medications or vitamins. Also bring your co-pay if you have one as well as your insurance cards. [facility] is located at [address], [address]. Should you need to reschedule your appointment, please contact them at [phone].   Thank you for choosing me and [facility].  Iva Boop, M.D., [facility]

## 2012-12-26 ENCOUNTER — Ambulatory Visit (INDEPENDENT_AMBULATORY_CARE_PROVIDER_SITE_OTHER): Payer: Medicare Other | Admitting: Surgery

## 2012-12-26 ENCOUNTER — Ambulatory Visit (INDEPENDENT_AMBULATORY_CARE_PROVIDER_SITE_OTHER): Payer: Self-pay | Admitting: General Surgery

## 2012-12-26 ENCOUNTER — Encounter (INDEPENDENT_AMBULATORY_CARE_PROVIDER_SITE_OTHER): Payer: Self-pay

## 2012-12-26 ENCOUNTER — Encounter (INDEPENDENT_AMBULATORY_CARE_PROVIDER_SITE_OTHER): Payer: Self-pay | Admitting: Surgery

## 2012-12-26 VITALS — BP 122/66 | HR 103 | Temp 97.3°F | Ht 64.0 in | Wt 163.4 lb

## 2012-12-26 DIAGNOSIS — K449 Diaphragmatic hernia without obstruction or gangrene: Secondary | ICD-10-CM

## 2012-12-26 NOTE — Progress Notes (Addendum)
Re:   Nichole Nichole Meza DOB:   Jul 12, 1931 MRN:   [id number]  ASSESSMENT AND PLAN: 1.  Hiatal hernia, large.  I discussed the surgical and medical options.  She has a good handle on the anatomy and risks of either option.  I showed her her UGI, which she said that she had already seen.  I gave her literature on hiatal hernia repair.   I discussed the risks, including, but not limited to bleeding, infection, injury to the bowel, the possible inability to get the stomach out of the chest, and possible gastrostomy.  She is anxious about surgery and knows its risks, but her symptoms have worsened and she thinks she wants to go ahead.  She has not talked to her children.  We discussed whether she has a shortened esophagus - on the UGI it appears at a normal location, but Dr. Leone Nichole Meza said that the GE junction is 33 cm, which is short.  I talked about the use a gastrostomy (temporary) to "tack" the stomach into the abdomen.  Plan: 1)To get cardiac clearance [Clearance from Dr. Diona Nichole Meza 12/31/2012. Okay to hold Plavix.  DN 01/04/2013], 2) Get records from pulmonologist [Done}, and 3) she wants to talk to her children.  She is  to call back about scheduling surgery or see me back in 2 or 3 months and see how she is doing.  I spent almost an hour with her going over her diagnosis and options.  2.  Barrett's esophagitis  Though most recent endo biopsies were negative. 3.  COPD  Sees pulmonologist - [facility] in [place] [phone])  To get records [Last seen 10/17/2012. "Stable".  DN  01/04/2013] 4.  History of depression 5.  Hypertension 6.  History of PAT's 7.  Right colon cancer - Colectomy - 2011  Done in [place].  Disease free. 8.  Loss of vision left eye, questionable embolic event - April 2013.  But she has recovered a lot of this. 9.  Mild ICA stenosis, L>R 10.  On Plavix  Chief Complaint  Patient presents with  . New Evaluation    eval hernia   REFERRING PHYSICIAN: Charlesetta Shanks,  MD  HISTORY OF PRESENT ILLNESS: Nichole Nichole Meza is a 79 y.o. (DOB: [birth year])  white  female whose primary care physician is Nichole Shanks, MD and comes to me today for consideration of hiatal hernia repair.  Ms. Sharp has had GI symptoms her whole life.  She even recounted when she was young, people thought that she was anorexic.  She has significant symptoms of reflux for 20 years.  She was seen by Dr. Elder Cyphers in [place], [place].  And mentioned to her at least 12 years ago that she had a hiatal hernia and associated Barrett's esophagus.  She blames some on this on bending and lifting in the yard.  She used to work out.  She said that her "chest does not move right".  And she often feels full early after she eats.   But she has worsening symptoms over the last several months.  She has been described as gluten intolerant.  She has seen Dr. Leone Nichole Meza, who did an upper endo on 10/25/2012.  He saw the EG junction at 33 cm.  He thought that she did have Barrett's esophagitis, but the biopsies did not show this.  He did note a massive hiatal hernia.  She had an UGI - 12/03/2012 - that showed a large sliding hiatal hernia with organoaxial gastric volvulus.  But contrast emptied  well.   She now takes Parker, which controls her symptoms fairly well.  She eats standing or sitting and does not like laying down.   Past Medical History  Diagnosis Date  . Essential hypertension, benign   . Mixed hyperlipidemia   . COPD (chronic obstructive pulmonary disease)   . Depression   . Barrett's esophagus   . Diverticulosis   . Hiatal hernia   . Colon cancer   . Vision loss of left eye     Sudden - 4/13  . PAT (paroxysmal atrial tachycardia)     Years ago was more problematic  . GERD (gastroesophageal reflux disease)       Past Surgical History  Procedure Laterality Date  . Left knee surgery    . Right colectomy  2010  . Bilateral cataract surgery    . Upper gastrointestinal endoscopy    . Colonoscopy    .  Breast surgery Right     breast biopsy  . Eye surgery Bilateral 2004    cataract      Current Outpatient Prescriptions  Medication Sig Dispense Refill  . amLODipine (NORVASC) 5 MG tablet Take 1 tablet by mouth Daily.      Florian Buff, Borago officinalis, (BORAGE OIL) 1000 MG CAPS Take 2 capsules by mouth daily.      . busPIRone (BUSPAR) 15 MG tablet       . Cholecalciferol (D-3-5) 5000 UNITS capsule Take 5,000 Units by mouth daily.      . clopidogrel (PLAVIX) 75 MG tablet Take 1 tablet (75 mg total) by mouth daily.  90 tablet  3  . desloratadine (CLARINEX) 5 MG tablet Take 5 mg by mouth daily as needed.       . fish oil-omega-3 fatty acids 1000 MG capsule Take 2 g by mouth daily.       . Flaxseed, Linseed, (FLAX SEED OIL) 1000 MG CAPS Take 2 capsules by mouth daily.       Marland Kitchen FLUoxetine (PROZAC) 20 MG capsule Take 20 mg by mouth daily.      Chilton Si Tea, Camillia sinensis, ([employer] GREEN TEA EXTRACT PO) Take 400 mg by mouth daily.      Marland Kitchen lisinopril (PRINIVIL,ZESTRIL) 10 MG tablet Take 1 tablet by mouth Daily.      . magnesium oxide (MAG-OX 400) 400 (241.3 MG) MG tablet Take 400 mg by mouth daily.      . rosuvastatin (CRESTOR) 5 MG tablet Take 1 tablet (5 mg total) by mouth daily.  90 tablet  3  . thiamine (VITAMIN B-1) 50 MG tablet Take 50 mg by mouth daily.      Marland Kitchen tretinoin (RETIN-A) 0.05 % cream Apply 1 application topically daily.       . Tryptophan 500 MG CAPS Take 1 capsule by mouth daily.      . VENTOLIN HFA 108 (90 BASE) MCG/ACT inhaler       . vitamin A 08657 UNIT capsule Take 10,000 Units by mouth daily.      . vitamin C (ASCORBIC ACID) 500 MG tablet Take 500 mg by mouth 2 (two) times daily.       No current facility-administered medications for this visit.      Allergies  Allergen Reactions  . Hctz (Hydrochlorothiazide) Rash  . Nexium (Esomeprazole Magnesium) Rash  . Prilosec (Omeprazole) Rash  . Vancomycin Other (See Comments)    BP dropped    REVIEW OF SYSTEMS: Skin:  No  history of rash.  No history  of abnormal moles. Infection:  No history of hepatitis or HIV.  No history of MRSA. Neurologic:  Left eye blindness in 2013, but is now 80 to 90% better.  Questionable embolic phenomenon. Cardiac:  No history of hypertension. No history of heart disease.  No history of prior cardiac catheterization.  Saw Dr. Diona Nichole Meza.  Had workup for source of emboli to eye, but this was negative.  She said that she has an irregular heart rate, but blames it on her hiatal hernia. Pulmonary:  Quit smoking 1986.  Has COPD.  No asthma or bronchitis.    Endocrine:  No diabetes. No thyroid disease.  Hypercholesterolemia. Gastrointestinal:  No history of stomach disease.  No history of liver disease.  No history of gall bladder disease.  No history of pancreas disease.  Right colon cancer resected in Dec 2011 by Dr. Pamala Hurry, at [facility] in [place], [place].  Ms. Charlie was not impressed with the nursing care.  She had no nodes and had no chemo tx. Urologic:  No history of kidney stones.  No history of bladder infections. Musculoskeletal:  Left knee replaced - 2002 Hematologic:  No bleeding disorder.  No history of anemia.  On Plavix. Psycho-social:  The patient is oriented.   History of depression - on Prozac and Buspar.  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 She likes playing golf. Son lives in [place], and daughter lives in [place], [place].  PHYSICAL EXAM: BP 122/66  Pulse 103  Temp(Src) 97.3 F (36.3 C) (Temporal)  Ht 5\' 4"  (1.626 m)  Wt 163 lb 6.4 oz (74.118 kg)  BMI 28.03 kg/m2  SpO2 93%  General: WN older WF who is alert and generally healthy appearing.   She asked about losing weight, but I don't think this has much to do with her hernia. HEENT: Normal. Pupils equal. Neck: Supple. No mass.  No thyroid mass. Lymph Nodes:  No supraclavicular or cervical nodes. Lungs: Clear to  auscultation and symmetric breath sounds. Heart:  Irregular rate today.  Question PVC's. No murmur or rub. Abdomen: Soft. No mass. No tenderness. No hernia. Normal bowel sounds.  5 cm right abdominal scar from her right colectomy. Rectal: Not done. Extremities:  Good strength and ROM  in upper and lower extremities. Neurologic:  Grossly intact to motor and sensory function. Psychiatric: Has normal mood and affect. Behavior is normal.   DATA REVIEWED: Notes from Dr. Leone Nichole Meza.  Ovidio Kin, MD,  [facility] [address]  [address]   [place], [place]    [address] Phone:  [phone] FAX:  [phone]

## 2013-01-01 ENCOUNTER — Telehealth (INDEPENDENT_AMBULATORY_CARE_PROVIDER_SITE_OTHER): Payer: Self-pay

## 2013-01-14 ENCOUNTER — Telehealth (INDEPENDENT_AMBULATORY_CARE_PROVIDER_SITE_OTHER): Payer: Self-pay

## 2013-01-14 NOTE — Telephone Encounter (Signed)
Pt returned your call. Pls call her back.

## 2013-01-29 ENCOUNTER — Telehealth (INDEPENDENT_AMBULATORY_CARE_PROVIDER_SITE_OTHER): Payer: Self-pay

## 2013-01-29 NOTE — Telephone Encounter (Signed)
I had spoke with patient a week ago she had stated she wanted to plan her surgery for July 7-19 .Her daughter will be available at that date/time . I had expressed Dr. Ezzard Standing was not available until the week of 03-05-13 and I would inform him of her decision to go forward with the hernia repair surgery. I called her today with a follow up call to make sure that  her surgery plans are still in place and to let her know I would be calling her the beginning of next week of Dr. Allene Pyo orders .Patient stated she was ready to move forward and will be waiting on my call.

## 2013-01-31 ENCOUNTER — Telehealth (INDEPENDENT_AMBULATORY_CARE_PROVIDER_SITE_OTHER): Payer: Self-pay

## 2013-01-31 NOTE — Telephone Encounter (Signed)
ERROR of dates requested for surgery ;Correction of Patient requested Dates of hernia repair surgery  are  July 7-18 2014

## 2013-02-20 ENCOUNTER — Other Ambulatory Visit: Payer: Self-pay | Admitting: Cardiology

## 2013-02-20 ENCOUNTER — Telehealth (HOSPITAL_COMMUNITY): Payer: Self-pay | Admitting: *Deleted

## 2013-02-20 NOTE — Telephone Encounter (Signed)
Refill sent for clopidogrel ( Plavix) 75 mg

## 2013-04-02 ENCOUNTER — Other Ambulatory Visit: Payer: Self-pay

## 2013-07-03 ENCOUNTER — Other Ambulatory Visit: Payer: Self-pay

## 2013-07-30 ENCOUNTER — Encounter: Payer: Self-pay | Admitting: Cardiology

## 2013-08-01 ENCOUNTER — Other Ambulatory Visit: Payer: Self-pay | Admitting: *Deleted

## 2013-08-06 ENCOUNTER — Encounter (INDEPENDENT_AMBULATORY_CARE_PROVIDER_SITE_OTHER): Payer: Medicare Other

## 2013-08-06 DIAGNOSIS — I779 Disorder of arteries and arterioles, unspecified: Secondary | ICD-10-CM

## 2013-08-06 DIAGNOSIS — I6529 Occlusion and stenosis of unspecified carotid artery: Secondary | ICD-10-CM

## 2013-08-07 ENCOUNTER — Telehealth: Payer: Self-pay | Admitting: *Deleted

## 2013-08-07 NOTE — Telephone Encounter (Signed)
Message copied by Eustace Moore on Thu Aug 07, 2013  3:47 PM ------      Message from: MCDOWELL, Illene Bolus      Created: Thu Aug 07, 2013 10:23 AM       Reviewed. Stable findings with 1-39% RICA stenosis and 40-59% RICA stenosis. Continue medical therapy. ------

## 2013-08-12 NOTE — Telephone Encounter (Signed)
Patient informed. 

## 2013-09-23 ENCOUNTER — Encounter: Payer: Self-pay | Admitting: Cardiology

## 2013-09-23 ENCOUNTER — Ambulatory Visit (INDEPENDENT_AMBULATORY_CARE_PROVIDER_SITE_OTHER): Payer: Medicare Other | Admitting: Cardiology

## 2013-09-23 VITALS — BP 125/76 | HR 99 | Ht 65.0 in | Wt 167.1 lb

## 2013-09-23 DIAGNOSIS — R002 Palpitations: Secondary | ICD-10-CM

## 2013-09-23 DIAGNOSIS — I471 Supraventricular tachycardia: Secondary | ICD-10-CM

## 2013-09-23 DIAGNOSIS — E782 Mixed hyperlipidemia: Secondary | ICD-10-CM

## 2013-09-23 DIAGNOSIS — I739 Peripheral vascular disease, unspecified: Principal | ICD-10-CM

## 2013-09-23 DIAGNOSIS — I779 Disorder of arteries and arterioles, unspecified: Secondary | ICD-10-CM

## 2013-09-23 MED ORDER — ROSUVASTATIN CALCIUM 5 MG PO TABS: 5.0000 mg | ORAL_TABLET | Freq: Every day | ORAL | Status: DC

## 2013-09-23 MED ORDER — CLOPIDOGREL BISULFATE 75 MG PO TABS: 75.0000 mg | ORAL_TABLET | Freq: Every day | ORAL | Status: DC

## 2013-09-23 NOTE — Assessment & Plan Note (Signed)
She will try Crestor 5 mg every other day and let us know how she does.

## 2013-09-23 NOTE — Assessment & Plan Note (Signed)
Continues on Plavix. Followup carotid Dopplers in December.

## 2013-09-23 NOTE — Progress Notes (Signed)
Clinical Summary Nichole Meza is an 80 y.o.female last seen in February 2014. She reports no palpitations or chest pain symptoms. Has been playing golf regularly.  ECG shows normal sinus rhythm.  Followup carotid Dopplers in December 2014 demonstrated stable 1-39% RICA stenosis and 08-65% LICA stenosis. We reviewed this today.  She stopped Crestor a few weeks ago due to leg pain, symptoms resolved off the medication. She had similar symptoms with Lipitor.   Allergies  Allergen Reactions  . Hctz [Hydrochlorothiazide] Rash  . Nexium [Esomeprazole Magnesium] Rash  . Prilosec [Omeprazole] Rash  . Vancomycin Other (See Comments)    BP dropped    Current Outpatient Prescriptions  Medication Sig Dispense Refill  . amLODipine (NORVASC) 5 MG tablet Take 1 tablet by mouth Daily.      Nichole Meza, Borago officinalis, (BORAGE OIL) 1000 MG CAPS Take 2 capsules by mouth daily.      . Cholecalciferol (D-3-5) 5000 UNITS capsule Take 5,000 Units by mouth daily.      . clopidogrel (PLAVIX) 75 MG tablet TAKE 1 TABLET (75 MG TOTAL) DAILY.  90 tablet  2  . Coenzyme Q10 (COQ10 PO) Take 1 capsule by mouth daily.      Nichole Meza Kitchen desloratadine (CLARINEX) 5 MG tablet Take 5 mg by mouth daily as needed.       . fish oil-omega-3 fatty acids 1000 MG capsule Take 2 g by mouth daily.       . Flaxseed, Linseed, (FLAX SEED OIL) 1000 MG CAPS Take 2 capsules by mouth daily.       Nichole Meza, Camillia sinensis, ([employer] GREEN Meza EXTRACT PO) Take 400 mg by mouth daily.      . magnesium oxide (MAG-OX 400) 400 (241.3 MG) MG tablet Take 400 mg by mouth 2 (two) times daily.       Nichole Meza Kitchen thiamine (VITAMIN B-1) 50 MG tablet Take 50 mg by mouth daily.      Nichole Meza Kitchen thyroid (ARMOUR) 15 MG tablet Take 30 mg by mouth daily.      Nichole Meza Kitchen tretinoin (RETIN-A) 0.05 % cream Apply 1 application topically daily.       . Tryptophan 500 MG CAPS Take 1 capsule by mouth daily.      . VENTOLIN HFA 108 (90 BASE) MCG/ACT inhaler Inhale 1 puff into the lungs as needed.        . vitamin A 10000 UNIT capsule Take 10,000 Units by mouth daily.      . vitamin C (ASCORBIC ACID) 500 MG tablet Take 500 mg by mouth 2 (two) times daily.      . rosuvastatin (CRESTOR) 5 MG tablet Take 1 tablet (5 mg total) by mouth daily.  90 tablet  3   No current facility-administered medications for this visit.    Past Medical History  Diagnosis Date  . Essential hypertension, benign   . Mixed hyperlipidemia   . COPD (chronic obstructive pulmonary disease)   . Depression   . Barrett's esophagus   . Diverticulosis   . Hiatal hernia   . Colon cancer   . Vision loss of left eye     Sudden - 4/13  . PAT (paroxysmal atrial tachycardia)     Years ago was more problematic  . GERD (gastroesophageal reflux disease)     Social History [name] reports that she quit smoking about 25 years ago. Her smoking use included Cigarettes. She has a 20 pack-year smoking history. She has never used smokeless tobacco. [name]  reports that she drinks alcohol.  Review of Systems Negative except as outlined.  Physical Examination Filed Vitals:   09/23/13 1515  BP: 125/76  Pulse: 99   Filed Weights   09/23/13 1515  Weight: 167 lb 1.9 oz (75.805 kg)    No acute distress.  HEENT: Conjunctiva and lids normal, oropharynx clear.  Neck: Supple, no elevated JVP, very soft left carotid bruit, no thyromegaly.  Lungs: Clear to auscultation, nonlabored breathing at rest.  Cardiac: Regular rate and rhythm, no S3, soft systolic murmur, no pericardial rub.  Extremities: Trace edema and spider veins, distal pulses 1-2+.  Neuropsychiatric: Alert and oriented x3, affect grossly appropriate.   Problem List and Plan   Carotid artery disease Continues on Plavix. Followup carotid Dopplers in December.  Mixed hyperlipidemia She will try Crestor 5 mg every other day and let us know how she does.  PAT (paroxysmal atrial tachycardia) No recent symptoms, sinus rhythm by ECG today.    Satira Sark, M.D., F.A.C.C.

## 2013-09-23 NOTE — Assessment & Plan Note (Signed)
No recent symptoms, sinus rhythm by ECG today.

## 2013-09-23 NOTE — Addendum Note (Signed)
Addended by: Merlene Laughter on: 09/23/2013 03:48 PM   Modules accepted: Orders

## 2013-09-23 NOTE — Patient Instructions (Addendum)
Your physician recommends that you schedule a follow-up appointment in: December 2015. You will receive a reminder letter in the mail in about 8 months reminding you to call and schedule your appointment. If you don't receive this letter, please contact our office. Your physician recommends that you continue on your current medications as directed. Please refer to the Current Medication list given to you today. Restart your crestor 5 mg taking 1 tablet every other day. Your physician has requested that you have a carotid duplex in December just before your next visit. This test is an ultrasound of the carotid arteries in your neck. It looks at blood flow through these arteries that supply the brain with blood. Allow one hour for this exam. There are no restrictions or special instructions.

## 2014-07-31 ENCOUNTER — Other Ambulatory Visit: Payer: Self-pay | Admitting: *Deleted

## 2014-07-31 DIAGNOSIS — I739 Peripheral vascular disease, unspecified: Principal | ICD-10-CM

## 2014-07-31 DIAGNOSIS — I779 Disorder of arteries and arterioles, unspecified: Secondary | ICD-10-CM

## 2014-08-05 ENCOUNTER — Encounter (INDEPENDENT_AMBULATORY_CARE_PROVIDER_SITE_OTHER): Payer: Medicare Other | Admitting: *Deleted

## 2014-08-05 DIAGNOSIS — I739 Peripheral vascular disease, unspecified: Secondary | ICD-10-CM

## 2014-08-05 DIAGNOSIS — I779 Disorder of arteries and arterioles, unspecified: Secondary | ICD-10-CM

## 2014-08-05 DIAGNOSIS — I6529 Occlusion and stenosis of unspecified carotid artery: Secondary | ICD-10-CM

## 2014-08-06 ENCOUNTER — Telehealth: Payer: Self-pay | Admitting: *Deleted

## 2014-08-06 NOTE — Progress Notes (Signed)
Opened in error

## 2014-08-06 NOTE — Telephone Encounter (Signed)
Patient informed. 

## 2014-08-06 NOTE — Telephone Encounter (Signed)
-----   Message from Satira Sark, MD sent at 08/06/2014  2:04 PM EST ----- Reviewed. Stable 1-39% RICA and 88-75% LICA stenoses. We will continue medical therapy. Repeat carotid Dopplers in one year.

## 2014-08-10 NOTE — Progress Notes (Signed)
This encounter was created in error - please disregard.

## 2014-08-31 ENCOUNTER — Ambulatory Visit (INDEPENDENT_AMBULATORY_CARE_PROVIDER_SITE_OTHER): Payer: Medicare Other | Admitting: Cardiology

## 2014-08-31 ENCOUNTER — Encounter: Payer: Self-pay | Admitting: Cardiology

## 2014-08-31 VITALS — BP 137/74 | HR 86 | Ht 65.5 in | Wt 166.0 lb

## 2014-08-31 DIAGNOSIS — I471 Supraventricular tachycardia: Secondary | ICD-10-CM

## 2014-08-31 DIAGNOSIS — I779 Disorder of arteries and arterioles, unspecified: Secondary | ICD-10-CM

## 2014-08-31 DIAGNOSIS — I739 Peripheral vascular disease, unspecified: Secondary | ICD-10-CM

## 2014-08-31 NOTE — Patient Instructions (Signed)
Continue all current medications. Your physician wants you to follow up in:  1 year.  You will receive a reminder letter in the mail one-two months in advance.  If you don't receive a letter, please call our office to schedule the follow up appointment  Your physician has requested that you have a carotid duplex. This test is an ultrasound of the carotid arteries in your neck. It looks at blood flow through these arteries that supply the brain with blood. Allow one hour for this exam. There are no restrictions or special instructions. - DUE JUST PRIOR TO NEXT VISIT.

## 2014-08-31 NOTE — Assessment & Plan Note (Signed)
Very infrequent palpitations. Continue observation.

## 2014-08-31 NOTE — Progress Notes (Signed)
Reason for visit: History of PAT, carotid artery disease  Clinical Summary [name] is an 81 y.o.female last seen in January 2015. She presents for a routine visit. Palpitations have been very infrequent, she is not on any specific medications for this and we continue observation.  Recent follow-up carotid Dopplers in December 2015 showed stable 1-39% RICA stenosis and 27-78% LICA stenosis. We discussed the results. She remains on Norvasc for blood pressure control and also Plavix. She has not been able to tolerate statin therapy.  She continues to follow with Dr. Sherrlyn Meza. She is on several supplements as detailed below. Previously on long-term antidepressant therapy, states that she plans to speak with Dr. Sherrlyn Meza about going back on treatment with some recurring depression symptoms.  Allergies  Allergen Reactions  . Hctz [Hydrochlorothiazide] Rash  . Nexium [Esomeprazole Magnesium] Rash  . Prilosec [Omeprazole] Rash  . Vancomycin Other (See Comments)    BP dropped    Current Outpatient Prescriptions  Medication Sig Dispense Refill  . amLODipine (NORVASC) 5 MG tablet Take 1 tablet by mouth Daily.    Nichole Meza, Nichole Meza, (BORAGE OIL) 1000 MG CAPS Take 2 capsules by mouth daily.    . Cholecalciferol (D-3-5) 5000 UNITS capsule Take 5,000 Units by mouth daily.    . clopidogrel (PLAVIX) 75 MG tablet Take 1 tablet (75 mg total) by mouth daily. 90 tablet 3  . Coenzyme Q10 (COQ10 PO) Take 1 capsule by mouth daily.    Marland Kitchen desloratadine (CLARINEX) 5 MG tablet Take 5 mg by mouth daily as needed.     . fish oil-omega-3 fatty acids 1000 MG capsule Take 2 g by mouth daily.     . Flaxseed, Linseed, (FLAX SEED OIL) 1000 MG CAPS Take 2 capsules by mouth daily.     Nichole Meza, Camillia sinensis, ([employer]S GREEN Meza EXTRACT PO) Take 400 mg by mouth daily.    Marland Kitchen levothyroxine (LEVOTHROID) 25 MCG tablet Take 25 mcg by mouth daily before breakfast.    . liothyronine (CYTOMEL) 5 MCG tablet Take 10 mcg  by mouth daily.    . magnesium oxide (MAG-OX 400) 400 (241.3 MG) MG tablet Take 400 mg by mouth 2 (two) times daily.     Marland Kitchen thiamine (VITAMIN B-1) 50 MG tablet Take 50 mg by mouth daily.    Marland Kitchen tretinoin (RETIN-A) 0.05 % cream Apply 1 application topically daily.     . Tryptophan 500 MG CAPS Take 1 capsule by mouth daily.    . VENTOLIN HFA 108 (90 BASE) MCG/ACT inhaler Inhale 1 puff into the lungs as needed.     . vitamin A 10000 UNIT capsule Take 10,000 Units by mouth daily.    . vitamin C (ASCORBIC ACID) 500 MG tablet Take 500 mg by mouth 2 (two) times daily.     No current facility-administered medications for this visit.    Past Medical History  Diagnosis Date  . Essential hypertension, benign   . Mixed hyperlipidemia   . COPD (chronic obstructive pulmonary disease)   . Depression   . Barrett's esophagus   . Diverticulosis   . Hiatal hernia   . Colon cancer   . Vision loss of left eye     Sudden - 4/13  . PAT (paroxysmal atrial tachycardia)     Years ago was more problematic  . GERD (gastroesophageal reflux disease)     Social History [name] reports that she quit smoking about 26 years ago. Her smoking use included Cigarettes.  She started smoking about 62 years ago. She has a 20 pack-year smoking history. She has never used smokeless tobacco. [name] reports that she drinks alcohol.  Review of Systems Complete review of systems negative except as otherwise outlined in the clinical summary and also the following. NYHA class II dyspnea. No orthopnea or PND.  Physical Examination Filed Vitals:   08/31/14 1403  BP: 137/74  Pulse: 86   Filed Weights   08/31/14 1403  Weight: 166 lb (75.297 kg)    Appears stable at rest. HEENT: Conjunctiva and lids normal, oropharynx clear.  Neck: Supple, no elevated JVP, very soft left carotid bruit, no thyromegaly.  Lungs: Clear to auscultation, nonlabored breathing at rest.  Cardiac: Regular rate and rhythm, no S3, soft  systolic murmur, no pericardial rub.  Extremities: Trace edema and spider veins, distal pulses 1-2+.  Neuropsychiatric: Alert and oriented x3, affect grossly appropriate.   Problem List and Plan   PAT (paroxysmal atrial tachycardia) Very infrequent palpitations. Continue observation.  Carotid artery disease Stable as outlined above. Follow-up carotid Dopplers for one year with clinical visit. She continues on Plavix and Norvasc, has not been able to tolerate statin therapy.    Satira Sark, M.D., F.A.C.C.

## 2014-08-31 NOTE — Assessment & Plan Note (Signed)
Stable as outlined above. Follow-up carotid Dopplers for one year with clinical visit. She continues on Plavix and Norvasc, has not been able to tolerate statin therapy.

## 2014-10-17 ENCOUNTER — Other Ambulatory Visit: Payer: Self-pay | Admitting: Cardiology

## 2015-02-17 IMAGING — RF DG UGI W/ HIGH DENSITY W/KUB
14 of 24 series · 14 of 24 positions shown · non-contrast
Comparison: None.

CLINICAL DATA: Hiatal hernia on endoscopy.

UPPER GI SERIES WITH KUB
TECHNIQUE: Routine upper GI series was performed with thin and
high density barium.
Fluoroscopy Time: 4.0 minutes

[Series 1: run · 1 of 1 slices shown (1 of 14)]
[im 1/1]
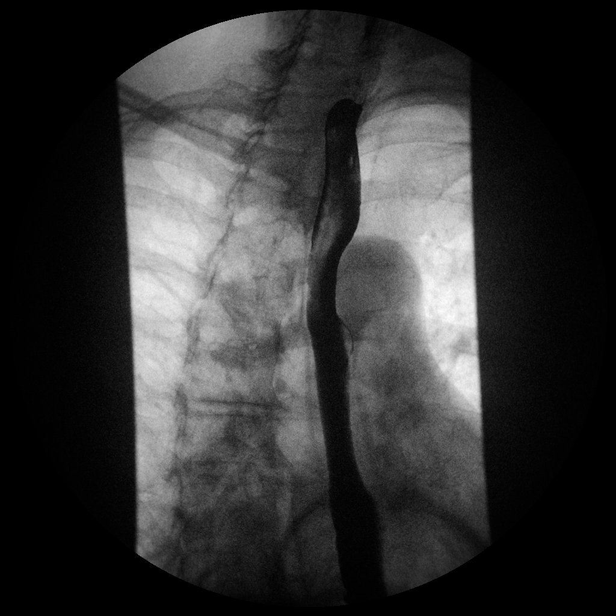

[Series 3: run · 1 of 1 slices shown (2 of 14)]
[im 1/1]
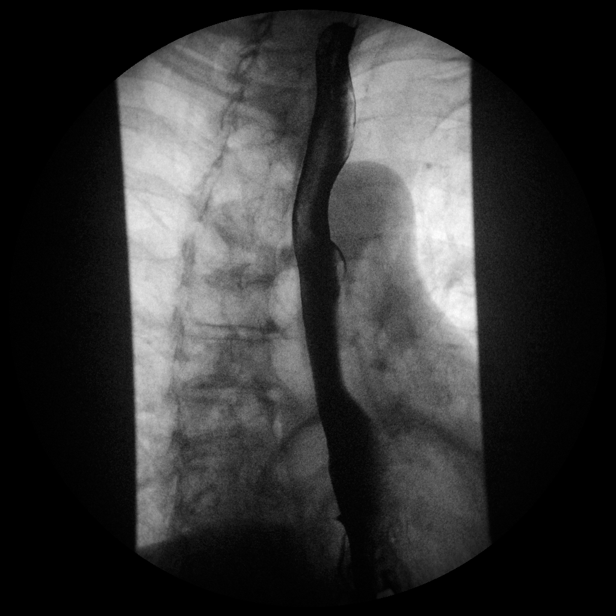

[Series 5: run · 1 of 1 slices shown (3 of 14)]
[im 1/1]
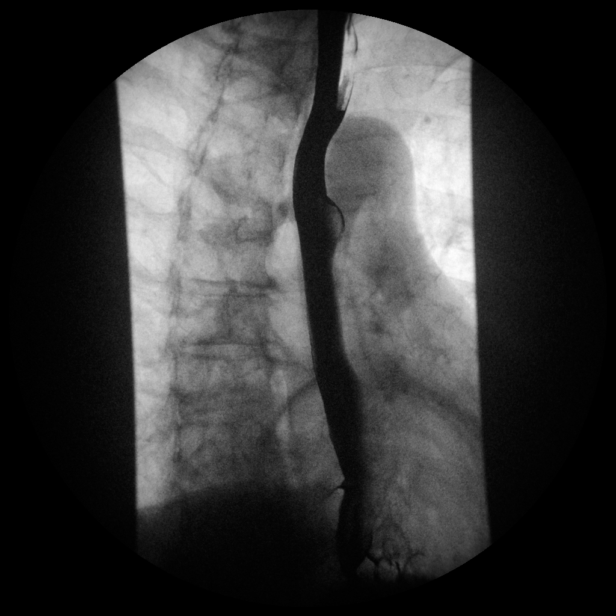

[Series 7: run · 1 of 1 slices shown (4 of 14)]
[im 1/1]
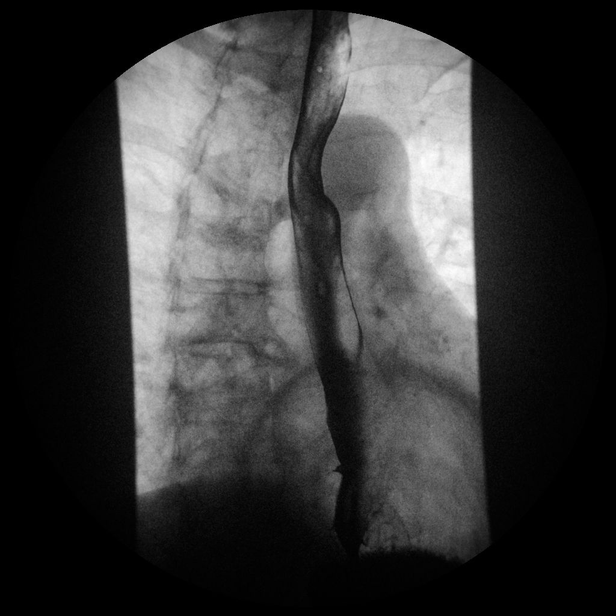

[Series 8: run · 1 of 1 slices shown (5 of 14)]
[im 1/1]
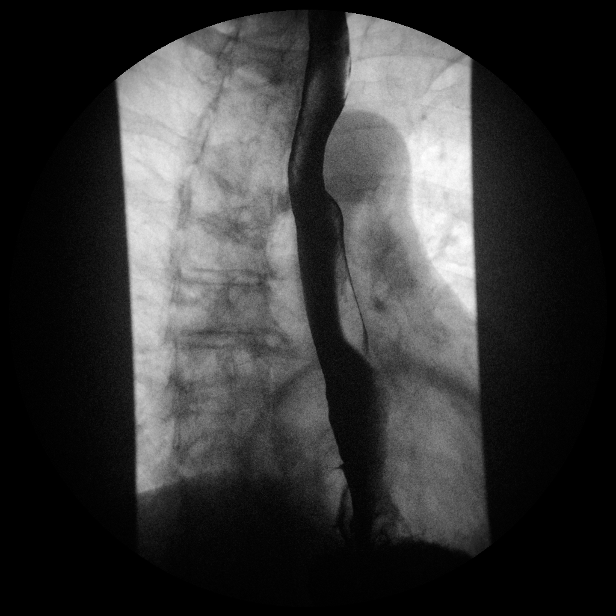

[Series 10: run · 1 of 1 slices shown (6 of 14)]
[im 1/1]
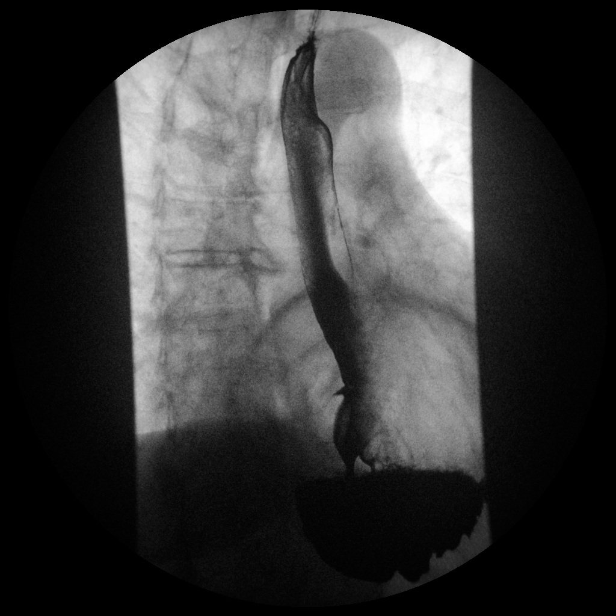

[Series 12: run · 1 of 1 slices shown (7 of 14)]
[im 1/1]
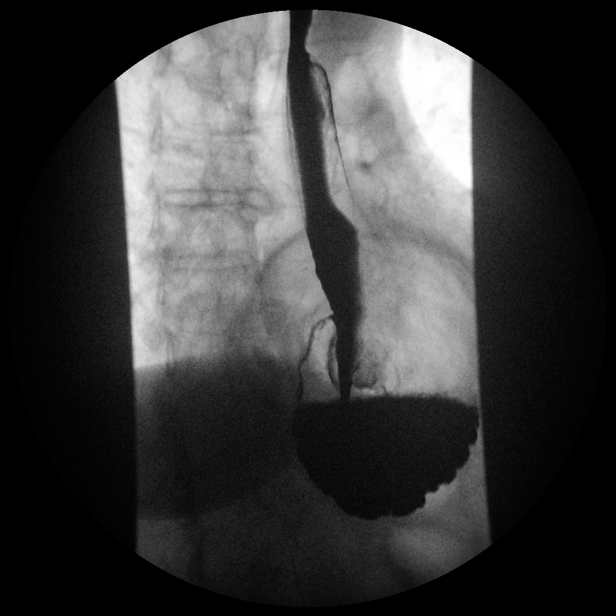

[Series 13: run · 1 of 1 slices shown (8 of 14)]
[im 1/1]
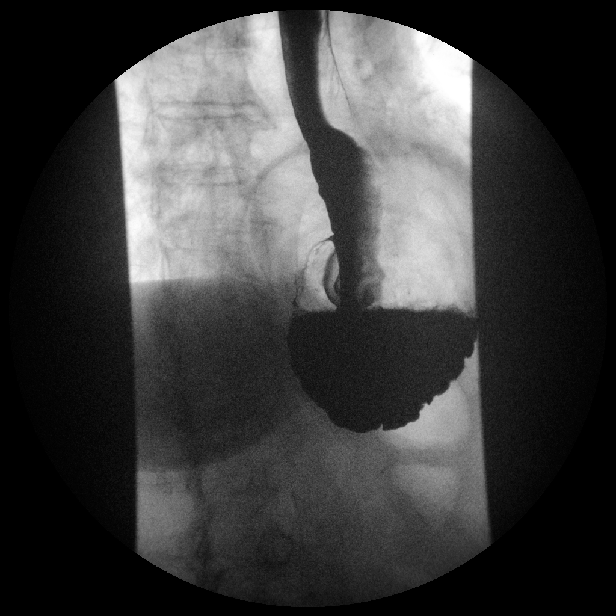

[Series 15: run · 1 of 1 slices shown (9 of 14)]
[im 1/1]
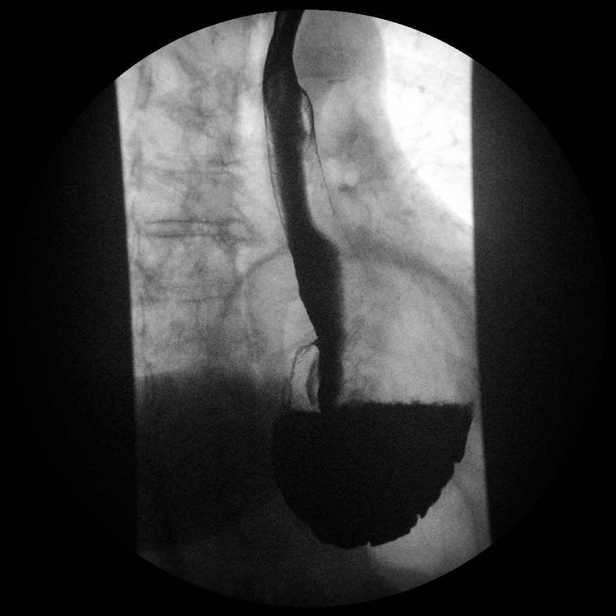

[Series 17: run · 1 of 1 slices shown (10 of 14)]
[im 1/1]
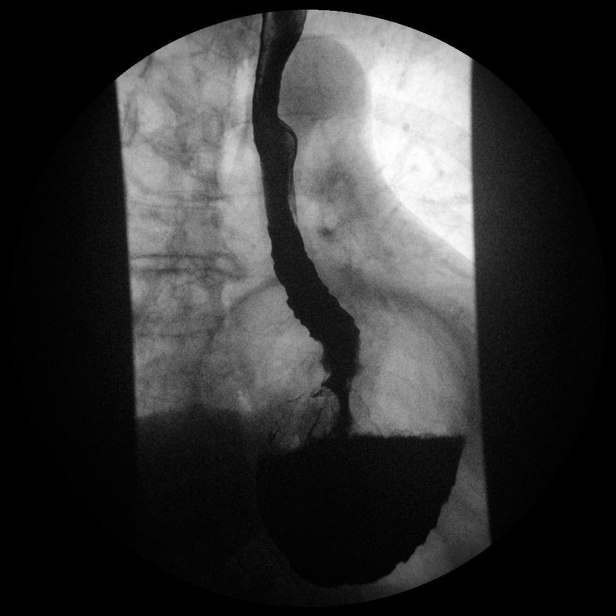

[Series 19: run · 1 of 1 slices shown (11 of 14)]
[im 1/1]
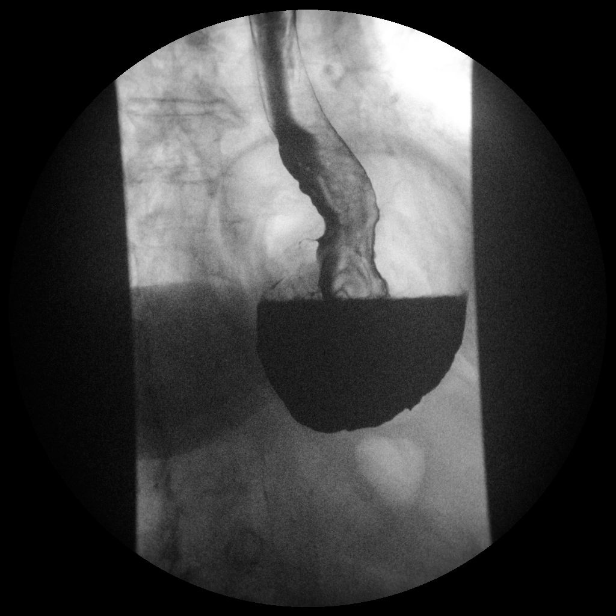

[Series 20: run · 1 of 1 slices shown (12 of 14)]
[im 1/1]
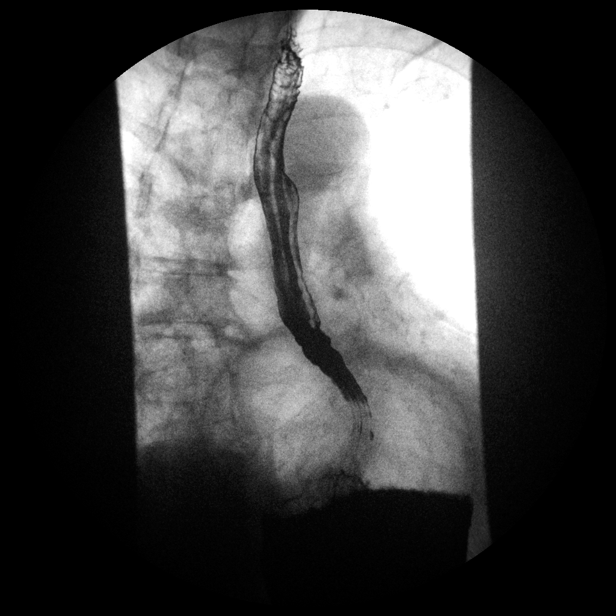

[Series 22: run · 1 of 1 slices shown (13 of 14)]
[im 1/1]
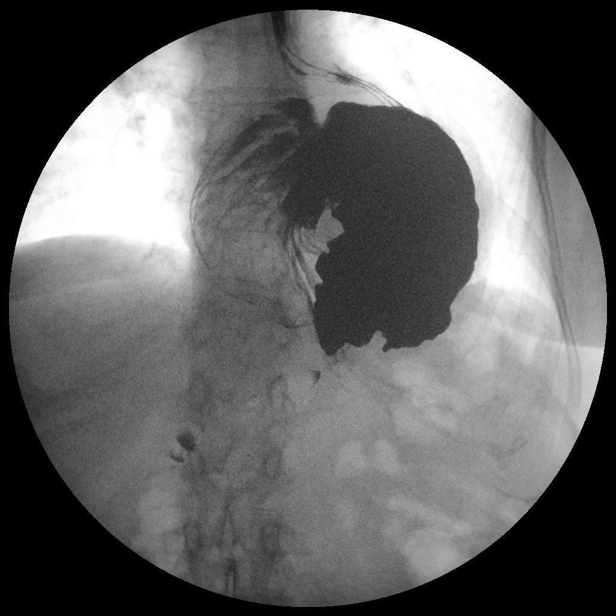

[Series 24: run · 1 of 1 slices shown (14 of 14)]
[im 1/1]
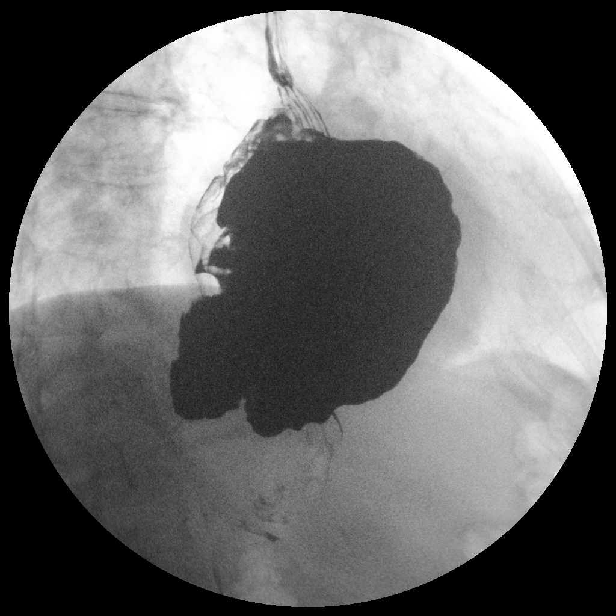

[14 of 24 positions shown; findings below may reference images not displayed]

FINDINGS: Pre procedure scout film demonstrates a nonobstructive
bowel gas pattern.  Double contrast evaluation of the esophagus
demonstrates no mucosal abnormality.

Double contrast evaluation of the stomach demonstrates no mucosal
abnormality or mass.  Prompt passage of contrast into the duodenal
bulb.

Evaluation of primary peristalsis is within normal limits.

Full column evaluation of the esophagus demonstrates no persistent
narrowing or stricture within.

A large sliding type hiatal hernia, with approximately 60% of the
stomach positioned within the chest. There is organoaxial volvulus.
No evidence of obstruction at the level of the diaphragm.
IMPRESSION: Large sliding type hiatal hernia with organoaxial gastric volvulus.
No complicating obstruction.

## 2015-05-26 ENCOUNTER — Telehealth: Payer: Self-pay | Admitting: Cardiology

## 2015-05-26 NOTE — Telephone Encounter (Signed)
Contacted patient today by telephone trying to schedule a follow up appointment for a 1 year fu Cartoids per Vas Report  Mailed reminder notification to patient.

## 2015-06-26 ENCOUNTER — Other Ambulatory Visit: Payer: Self-pay | Admitting: Cardiology

## 2015-07-07 ENCOUNTER — Other Ambulatory Visit: Payer: Self-pay | Admitting: Cardiology

## 2015-07-07 DIAGNOSIS — I6523 Occlusion and stenosis of bilateral carotid arteries: Secondary | ICD-10-CM

## 2015-08-04 ENCOUNTER — Ambulatory Visit: Payer: Medicare Other

## 2015-08-04 DIAGNOSIS — I6523 Occlusion and stenosis of bilateral carotid arteries: Secondary | ICD-10-CM

## 2015-08-06 ENCOUNTER — Telehealth: Payer: Self-pay | Admitting: *Deleted

## 2015-08-06 NOTE — Telephone Encounter (Signed)
-----   Message from Satira Sark, MD sent at 08/05/2015  2:06 PM EST ----- Reviewed report. Stable 1-39% RICA stenosis and 123456 LICA stenosis. Continue medical therapy.an

## 2015-08-06 NOTE — Telephone Encounter (Signed)
Patient informed. 

## 2015-08-18 ENCOUNTER — Other Ambulatory Visit (INDEPENDENT_AMBULATORY_CARE_PROVIDER_SITE_OTHER): Payer: Medicare Other

## 2015-08-18 ENCOUNTER — Encounter: Payer: Self-pay | Admitting: Internal Medicine

## 2015-08-18 ENCOUNTER — Ambulatory Visit (INDEPENDENT_AMBULATORY_CARE_PROVIDER_SITE_OTHER): Payer: Medicare Other | Admitting: Internal Medicine

## 2015-08-18 VITALS — BP 120/70 | HR 80 | Ht 65.0 in | Wt 168.0 lb

## 2015-08-18 DIAGNOSIS — R10819 Abdominal tenderness, unspecified site: Secondary | ICD-10-CM

## 2015-08-18 DIAGNOSIS — R1031 Right lower quadrant pain: Secondary | ICD-10-CM

## 2015-08-18 DIAGNOSIS — R1032 Left lower quadrant pain: Secondary | ICD-10-CM | POA: Diagnosis not present

## 2015-08-18 DIAGNOSIS — I6523 Occlusion and stenosis of bilateral carotid arteries: Secondary | ICD-10-CM

## 2015-08-18 DIAGNOSIS — R197 Diarrhea, unspecified: Secondary | ICD-10-CM

## 2015-08-18 LAB — CBC WITH DIFFERENTIAL/PLATELET
BASOS PCT: 0.8 % (ref 0.0–3.0)
Basophils Absolute: 0 10*3/uL (ref 0.0–0.1)
EOS ABS: 0.1 10*3/uL (ref 0.0–0.7)
Eosinophils Relative: 2 % (ref 0.0–5.0)
HEMATOCRIT: 41.2 % (ref 36.0–46.0)
Hemoglobin: 13.9 g/dL (ref 12.0–15.0)
Lymphocytes Relative: 32.5 % (ref 12.0–46.0)
Lymphs Abs: 1.8 10*3/uL (ref 0.7–4.0)
MCHC: 33.7 g/dL (ref 30.0–36.0)
MCV: 95.9 fl (ref 78.0–100.0)
MONOS PCT: 8.9 % (ref 3.0–12.0)
Monocytes Absolute: 0.5 10*3/uL (ref 0.1–1.0)
NEUTROS PCT: 55.8 % (ref 43.0–77.0)
Neutro Abs: 3 10*3/uL (ref 1.4–7.7)
Platelets: 222 10*3/uL (ref 150.0–400.0)
RBC: 4.3 Mil/uL (ref 3.87–5.11)
RDW: 12.9 % (ref 11.5–15.5)
WBC: 5.4 10*3/uL (ref 4.0–10.5)

## 2015-08-18 LAB — COMPREHENSIVE METABOLIC PANEL
ALT: 16 U/L (ref 0–35)
AST: 20 U/L (ref 0–37)
Albumin: 4.1 g/dL (ref 3.5–5.2)
Alkaline Phosphatase: 66 U/L (ref 39–117)
BUN: 16 mg/dL (ref 6–23)
CALCIUM: 9.4 mg/dL (ref 8.4–10.5)
CHLORIDE: 103 meq/L (ref 96–112)
CO2: 29 mEq/L (ref 19–32)
Creatinine, Ser: 0.77 mg/dL (ref 0.40–1.20)
GFR: 76.21 mL/min (ref >60.00)
Glucose, Bld: 119 mg/dL — ABNORMAL HIGH (ref 70–99)
POTASSIUM: 4.3 meq/L (ref 3.5–5.1)
Sodium: 140 mEq/L (ref 135–145)
Total Bilirubin: 0.6 mg/dL (ref 0.2–1.2)
Total Protein: 6.8 g/dL (ref 6.0–8.3)

## 2015-08-18 NOTE — Patient Instructions (Signed)
   Your physician has requested that you go to the basement for the following lab work before leaving today: CBC/diff, CMET, C-diff stool test   We will contact you with results and plans.    I appreciate the opportunity to care for you. Silvano Rusk, MD, [facility]

## 2015-08-18 NOTE — Progress Notes (Signed)
   Subjective:    Patient ID: Nichole Meza, female    DOB: 1931-04-18, 81 y.o.   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 She describes them as mostly watery. She saw gynecology this week and a pelvic exam was done and after that the gynecologist recommended and ordered an ultrasound of the abdomen.  There hasn't been any bleeding that I am aware of, no fevers. She took antibiotics related to skin surgery, had a skin graft for basal cell carcinoma removal about 2 months ago.  Medications, allergies, past medical history, past surgical history, family history and social history are reviewed and updated in the EMR.  Review of Systems She's had some back pain, initially with some sort of injury. It has persisted. She is concerned about the symptoms she is having.  She has the tic mammal meat allergy and she has eliminated all mammal alien meat and has much less indigestion and "hiatal hernia problems".    Objective:   Physical Exam  BP 120/70 mmHg  Pulse 80  Ht 5\' 5"  (1.651 m)  Wt 168 lb (76.204 kg)  BMI 27.96 kg/m2  Elderly white woman, no acute distress Abdomen is soft she has some pain in the infraumbilical and suprapubic areas, there some intermittent involuntary guarding. Overall relatively benign except for these features. Sounds are present somewhat increased.  No CVA or back tenderness  is alert and oriented 3     Assessment & Plan:   1. Lower abdominal tenderness   2. Acute bilateral lower abdominal pain   3. Diarrhea, unspecified type    It's possible all of this is caused by C. difficile. I'm checking for that today we'll get a CBC and a seen that.  Ultrasound is reasonable but I think a CT might give [place] more information. If C. difficile is negative a CT abdomen  pelvis with contrast will be ordered depending upon her BUN and creatinine.  Colonoscopy will be in reserve. She would have to hold her Plavix. 88 even though she did have colon cancer was 6 years ago so as far as routine investigation and screening/surveillance I don't think that's absolutely required based upon age. We will sort this out pending other results.  He does not need repeat endoscopy for history of Barrett's, on last endoscopy I did not see true Barrett's, I think she might have had biopsies from the hernia sac previously.  She has had a reaction to IV vancomycin in the past so I'm not sure she can take oral vancomycin. Metronidazole might be the first step in her if C. difficile positive  If she needs a CT she would like to do it at [place]  Copies will be sent to Dr. Shearon Stalls (PCP) and Dr. Hart Robinsons (derm) [phone]

## 2015-08-19 DIAGNOSIS — R1031 Right lower quadrant pain: Secondary | ICD-10-CM

## 2015-08-19 DIAGNOSIS — R10819 Abdominal tenderness, unspecified site: Secondary | ICD-10-CM

## 2015-08-19 DIAGNOSIS — R1032 Left lower quadrant pain: Secondary | ICD-10-CM

## 2015-08-19 LAB — CLOSTRIDIUM DIFFICILE BY PCR: Toxigenic C. Difficile by PCR: NOT DETECTED

## 2015-08-19 NOTE — Progress Notes (Signed)
Quick Note:  C diff was negative Other labs ok too Needs CT abd/pelvis with contrast - wanted to do at [place]  Dx is lower abdominal pain and bilateral lower quadrant abdominal tenderness ______

## 2015-08-31 ENCOUNTER — Telehealth: Payer: Self-pay | Admitting: Internal Medicine

## 2015-08-31 NOTE — Telephone Encounter (Signed)
12/30 CT in [place] showed enlarging right ovarian cyst -   Diarrhea is better  I advised her to see her GYN and have the cust evaluated further.  I will be on standby - f/u me prn

## 2015-09-08 ENCOUNTER — Encounter: Payer: Self-pay | Admitting: Cardiology

## 2015-09-08 ENCOUNTER — Ambulatory Visit (INDEPENDENT_AMBULATORY_CARE_PROVIDER_SITE_OTHER): Payer: Medicare Other | Admitting: Cardiology

## 2015-09-08 VITALS — BP 122/60 | HR 87 | Ht 65.0 in | Wt 160.0 lb

## 2015-09-08 DIAGNOSIS — I779 Disorder of arteries and arterioles, unspecified: Secondary | ICD-10-CM | POA: Diagnosis not present

## 2015-09-08 DIAGNOSIS — Z8679 Personal history of other diseases of the circulatory system: Secondary | ICD-10-CM

## 2015-09-08 DIAGNOSIS — I358 Other nonrheumatic aortic valve disorders: Secondary | ICD-10-CM | POA: Diagnosis not present

## 2015-09-08 DIAGNOSIS — I10 Essential (primary) hypertension: Secondary | ICD-10-CM

## 2015-09-08 DIAGNOSIS — I739 Peripheral vascular disease, unspecified: Secondary | ICD-10-CM

## 2015-09-08 NOTE — Progress Notes (Signed)
Cardiology Office Note  Date: 09/08/2015   ID: Selinda Flavin, DOB 1931-04-25, MRN [id number]  PCP: Kennieth Rad, MD  Primary Cardiologist: Rozann Lesches, MD   Chief Complaint  Patient presents with  . Carotid artery disease  . History of atrial tachycardia    History of Present Illness: MAHATHI WIECHMAN is an 82 y.o. female last seen in January 2016. She presents for a routine follow-up visit. Since last evaluation she has not had any significant palpitations. She reports chronic dyspnea on exertion and attributes this to a large hiatal hernia as well as history of asthma. She has not been using maintenance inhalers. She reports no exertional chest pain.  ECG today shows sinus rhythm with decreased R wave progression and low voltage, nonspecific T-wave changes.  We discussed her medications which are outlined below. She continues on Norvasc and Plavix. Recent carotid Dopplers from December 2016 outlined below. We discussed the results today.  Past Medical History  Diagnosis Date  . Essential hypertension, benign   . Mixed hyperlipidemia   . COPD (chronic obstructive pulmonary disease) ([place])   . Depression   . Barrett's esophagus   . Diverticulosis   . Hiatal hernia   . Colon cancer ([place])   . Vision loss of left eye     Sudden - 4/13  . PAT (paroxysmal atrial tachycardia) ([place])     Years ago was more problematic  . GERD (gastroesophageal reflux disease)   . Allergy to meat     Mammallian, alpha gal abs    Current Outpatient Prescriptions  Medication Sig Dispense Refill  . amLODipine (NORVASC) 5 MG tablet Take 1 tablet by mouth Daily.    Chong Sicilian, Borago officinalis, (BORAGE OIL) 1000 MG CAPS Take 2 capsules by mouth daily.    . Cholecalciferol (D-3-5) 5000 UNITS capsule Take 5,000 Units by mouth daily.    . clopidogrel (PLAVIX) 75 MG tablet Take 1 tablet (75 mg total) by mouth daily. 90 tablet 0  . Coenzyme Q10 (COQ10 PO) Take 1 capsule by mouth daily.    Marland Kitchen  desloratadine (CLARINEX) 5 MG tablet Take 5 mg by mouth daily as needed.     . fish oil-omega-3 fatty acids 1000 MG capsule Take 2 g by mouth daily.     . Flaxseed, Linseed, (FLAX SEED OIL) 1000 MG CAPS Take 2 capsules by mouth daily.     Marland Kitchen FLUoxetine (PROZAC) 20 MG capsule     . Green Tea, Camillia sinensis, ([employer] GREEN TEA EXTRACT PO) Take 400 mg by mouth daily.    Marland Kitchen levothyroxine (LEVOTHROID) 25 MCG tablet Take 25 mcg by mouth daily before breakfast.    . liothyronine (CYTOMEL) 5 MCG tablet Take 15 mcg by mouth daily.     . magnesium oxide (MAG-OX 400) 400 (241.3 MG) MG tablet Take 400 mg by mouth 2 (two) times daily.     Marland Kitchen thiamine (VITAMIN B-1) 50 MG tablet Take 50 mg by mouth daily.    Marland Kitchen tretinoin (RETIN-A) 0.05 % cream Apply 1 application topically daily.     . Tryptophan 500 MG CAPS Take 1 capsule by mouth daily.    . VENTOLIN HFA 108 (90 BASE) MCG/ACT inhaler Inhale 1 puff into the lungs as needed.     . vitamin A 10000 UNIT capsule Take 10,000 Units by mouth daily.    . vitamin C (ASCORBIC ACID) 500 MG tablet Take 500 mg by mouth 2 (two) times daily.     No  current facility-administered medications for this visit.   Allergies:  Hctz; Nexium; Prilosec; and Vancomycin   Social History: The patient  reports that she quit smoking about 27 years ago. Her smoking use included Cigarettes. She started smoking about 63 years ago. She has a 20 pack-year smoking history. She has never used smokeless tobacco. She reports that she drinks alcohol. She reports that she does not use illicit drugs.   Family History: The patient's family history includes Coronary artery disease in her father; Dementia in her mother; Heart disease in her mother. There is no history of Colon cancer.   ROS:  Please see the history of present illness. Otherwise, complete review of systems is positive for chronic arthritic pains, back pain.  All other systems are reviewed and negative.   Physical Exam: VS:  BP 122/60  mmHg  Pulse 87  Ht 5\' 5"  (1.651 m)  Wt 160 lb (72.576 kg)  BMI 26.63 kg/m2  SpO2 97%, BMI Body mass index is 26.63 kg/(m^2).  Wt Readings from Last 3 Encounters:  09/08/15 160 lb (72.576 kg)  08/18/15 168 lb (76.204 kg)  08/31/14 166 lb (75.297 kg)    Elderly woman, appears comfortable at rest. HEENT: Conjunctiva and lids normal, oropharynx clear.  Neck: Supple, no elevated JVP, very soft left carotid bruit, no thyromegaly.  Lungs: Clear to auscultation, nonlabored breathing at rest.  Cardiac: Regular rate and rhythm, no S3, [id number] basal systolic murmur, no pericardial rub.  Extremities: Trace edema and spider veins, distal pulses 1-2+.  Neuropsychiatric: Alert and oriented x3, affect grossly appropriate.  ECG: ECG is ordered today.  Recent Labwork: 08/18/2015: ALT 16; AST 20; BUN 16; Creatinine, Ser 0.77; Hemoglobin 13.9; Platelets 222.0; Potassium 4.3; Sodium 140   Other Studies Reviewed Today:  Carotid Dopplers 08/04/2015: Stable 1-39% RICA and 123456 LICA stenoses.  Echocardiogram 01/10/2012 [facility]): LVEF 60-65% with grade 1 diastolic dysfunction, sclerotic aortic valve without stenosis, trace aortic regurgitation, trace tricuspid regurgitation with RVSP 30 mmHg.  Assessment and Plan:  1. History of PAT, no significant palpitations over the last year. She is not on any specific medications for heart rate control at this time. ECG reviewed. Continue observation.  2. Carotid artery disease, stable by recent carotid Dopplers in December 2016. She continues on Plavix. She has a history of statin intolerance.  3. Sclerotic aortic valve without stenosis, stable cardiac murmur. No indication for follow-up testing at this time.  Current medicines were reviewed with the patient today.   Orders Placed This Encounter  Procedures  . EKG 12-Lead    Disposition: FU with me in 1 year.   Signed, Satira Sark, MD, [facility] 09/08/2015 11:14 AM    [facility] [place], [place], [place] [address] Phone: [phone]; Fax: [phone]

## 2015-09-08 NOTE — Patient Instructions (Addendum)
Your physician recommends that you continue on your current medications as directed. Please refer to the Current Medication list given to you today. Your physician has requested that you have a carotid duplex in 1 year just before your next visit. This test is an ultrasound of the carotid arteries in your neck. It looks at blood flow through these arteries that supply the brain with blood. Allow one hour for this exam. There are no restrictions or special instructions. Your physician recommends that you schedule a follow-up appointment in: 1 year. You will receive a reminder letter in the mail in about 10 months reminding you to call and schedule your appointment. If you don't receive this letter, please contact our office. 

## 2015-09-24 ENCOUNTER — Other Ambulatory Visit: Payer: Self-pay | Admitting: Cardiology

## 2016-08-17 ENCOUNTER — Other Ambulatory Visit: Payer: Self-pay | Admitting: Cardiology

## 2016-10-26 ENCOUNTER — Other Ambulatory Visit: Payer: Self-pay | Admitting: Cardiology

## 2017-03-12 ENCOUNTER — Other Ambulatory Visit: Payer: Self-pay | Admitting: Cardiology

## 2017-03-21 NOTE — Telephone Encounter (Signed)
Numerous attempts to contact patient with recall letters. Unable to reach by telephone. with no success.   08/27/2015 1:44 PM New [10]    [System] 04/28/2016 11:04 PM Notification Sent [20]   Nichole Meza [[id number]] 05/04/2016 4:44 PM Notification Sent [20]   Nichole Meza [[id number]] 11/13/2016 2:06 PM Notification Sent [20]   Nichole Meza [[id number]] 01/03/2017 3:41 PM Notification Sent [20]   Nichole Meza [[id number]] 03/21/2017 8:41 AM Notification Sent [20

## 2018-06-18 ENCOUNTER — Telehealth: Payer: Self-pay | Admitting: Cardiology

## 2018-06-18 NOTE — Telephone Encounter (Signed)
Numerous attempts to contact patient with recall letters. Unable to reach by telephone. with no success.  Delfino Lovett T [[id number]] 07/04/2017 8:24 AM New [10]    Chanda Busing [[id number]] 07/04/2017 8:26 AM Notification Sent [20]   Merlene Laughter, RN [[id number]] 03/13/2018 5:28 PM Notification Sent [20]   Chanda Busing [[id number]] 06/18/2018 11:42 AM Notification Sent [20]
